# Patient Record
Sex: Male | Born: 1950 | Race: White | Hispanic: No | Marital: Married | Smoking: Former smoker
Health system: Southern US, Community
[De-identification: ages and names within clinical notes are randomized; demographics above are authoritative.]

## PROBLEM LIST (undated history)

## (undated) DIAGNOSIS — I639 Cerebral infarction, unspecified: Secondary | ICD-10-CM

## (undated) DIAGNOSIS — I10 Essential (primary) hypertension: Secondary | ICD-10-CM

## (undated) DIAGNOSIS — R0609 Other forms of dyspnea: Secondary | ICD-10-CM

## (undated) DIAGNOSIS — R51 Headache: Secondary | ICD-10-CM

## (undated) DIAGNOSIS — F32A Depression, unspecified: Secondary | ICD-10-CM

## (undated) DIAGNOSIS — I2510 Atherosclerotic heart disease of native coronary artery without angina pectoris: Secondary | ICD-10-CM

## (undated) DIAGNOSIS — I441 Atrioventricular block, second degree: Secondary | ICD-10-CM

## (undated) DIAGNOSIS — F329 Major depressive disorder, single episode, unspecified: Secondary | ICD-10-CM

## (undated) DIAGNOSIS — R600 Localized edema: Secondary | ICD-10-CM

## (undated) DIAGNOSIS — I739 Peripheral vascular disease, unspecified: Secondary | ICD-10-CM

## (undated) DIAGNOSIS — K449 Diaphragmatic hernia without obstruction or gangrene: Secondary | ICD-10-CM

## (undated) DIAGNOSIS — J45909 Unspecified asthma, uncomplicated: Secondary | ICD-10-CM

## (undated) DIAGNOSIS — M1990 Unspecified osteoarthritis, unspecified site: Secondary | ICD-10-CM

## (undated) DIAGNOSIS — E785 Hyperlipidemia, unspecified: Secondary | ICD-10-CM

## (undated) DIAGNOSIS — K219 Gastro-esophageal reflux disease without esophagitis: Secondary | ICD-10-CM

## (undated) DIAGNOSIS — K760 Fatty (change of) liver, not elsewhere classified: Secondary | ICD-10-CM

## (undated) DIAGNOSIS — R519 Headache, unspecified: Secondary | ICD-10-CM

## (undated) DIAGNOSIS — R0600 Dyspnea, unspecified: Secondary | ICD-10-CM

## (undated) DIAGNOSIS — G4730 Sleep apnea, unspecified: Secondary | ICD-10-CM

## (undated) HISTORY — DX: Essential (primary) hypertension: I10

## (undated) HISTORY — PX: PR VEIN BYPASS GRAFT,AORTO-FEM-POP: 35551

## (undated) HISTORY — DX: Localized edema: R60.0

## (undated) HISTORY — DX: Hyperlipidemia, unspecified: E78.5

## (undated) HISTORY — DX: Diaphragmatic hernia without obstruction or gangrene: K44.9

## (undated) HISTORY — PX: FINGER ARTHROPLASTY: SHX5017

## (undated) HISTORY — PX: TONSILLECTOMY: SUR1361

## (undated) HISTORY — DX: Sleep apnea, unspecified: G47.30

## (undated) HISTORY — PX: CARDIAC CATHETERIZATION: SHX172

## (undated) HISTORY — DX: Depression, unspecified: F32.A

## (undated) HISTORY — DX: Other forms of dyspnea: R06.09

## (undated) HISTORY — DX: Gastro-esophageal reflux disease without esophagitis: K21.9

## (undated) HISTORY — DX: Major depressive disorder, single episode, unspecified: F32.9

## (undated) HISTORY — DX: Dyspnea, unspecified: R06.00

## (undated) HISTORY — PX: CYSTOSCOPY W/ URETEROSCOPY W/ LITHOTRIPSY: SUR380

## (undated) HISTORY — DX: Atherosclerotic heart disease of native coronary artery without angina pectoris: I25.10

## (undated) HISTORY — DX: Peripheral vascular disease, unspecified: I73.9

## (undated) HISTORY — PX: COLONOSCOPY: SHX174

## (undated) HISTORY — DX: Atrioventricular block, second degree: I44.1

---

## 1998-01-28 ENCOUNTER — Ambulatory Visit (HOSPITAL_COMMUNITY): Admission: RE | Admit: 1998-01-28 | Discharge: 1998-01-28 | Payer: Self-pay | Admitting: Urology

## 1998-01-28 ENCOUNTER — Encounter: Payer: Self-pay | Admitting: Urology

## 1999-01-09 ENCOUNTER — Encounter: Admission: RE | Admit: 1999-01-09 | Discharge: 1999-01-09 | Payer: Self-pay | Admitting: *Deleted

## 1999-01-23 ENCOUNTER — Encounter (HOSPITAL_COMMUNITY): Admission: RE | Admit: 1999-01-23 | Discharge: 1999-04-23 | Payer: Self-pay | Admitting: Neurology

## 1999-08-27 ENCOUNTER — Encounter: Payer: Self-pay | Admitting: Urology

## 1999-08-27 ENCOUNTER — Ambulatory Visit (HOSPITAL_COMMUNITY): Admission: RE | Admit: 1999-08-27 | Discharge: 1999-08-27 | Payer: Self-pay | Admitting: Urology

## 1999-09-02 ENCOUNTER — Encounter: Admission: RE | Admit: 1999-09-02 | Discharge: 1999-09-02 | Payer: Self-pay | Admitting: Urology

## 1999-09-02 ENCOUNTER — Encounter: Payer: Self-pay | Admitting: Urology

## 1999-09-03 ENCOUNTER — Ambulatory Visit (HOSPITAL_COMMUNITY): Admission: RE | Admit: 1999-09-03 | Discharge: 1999-09-03 | Payer: Self-pay | Admitting: Urology

## 1999-09-08 ENCOUNTER — Encounter: Payer: Self-pay | Admitting: Urology

## 1999-09-08 ENCOUNTER — Ambulatory Visit (HOSPITAL_COMMUNITY): Admission: RE | Admit: 1999-09-08 | Discharge: 1999-09-08 | Payer: Self-pay | Admitting: Urology

## 2000-05-09 ENCOUNTER — Encounter: Payer: Self-pay | Admitting: Internal Medicine

## 2000-05-09 ENCOUNTER — Encounter: Admission: RE | Admit: 2000-05-09 | Discharge: 2000-05-09 | Payer: Self-pay | Admitting: Internal Medicine

## 2000-06-13 ENCOUNTER — Other Ambulatory Visit: Admission: RE | Admit: 2000-06-13 | Discharge: 2000-06-13 | Payer: Self-pay | Admitting: Internal Medicine

## 2000-10-18 ENCOUNTER — Encounter: Payer: Self-pay | Admitting: Urology

## 2000-10-18 ENCOUNTER — Encounter: Admission: RE | Admit: 2000-10-18 | Discharge: 2000-10-18 | Payer: Self-pay | Admitting: Urology

## 2001-01-23 ENCOUNTER — Ambulatory Visit (HOSPITAL_BASED_OUTPATIENT_CLINIC_OR_DEPARTMENT_OTHER): Admission: RE | Admit: 2001-01-23 | Discharge: 2001-01-23 | Payer: Self-pay | Admitting: Orthopedic Surgery

## 2003-04-10 ENCOUNTER — Encounter: Admission: RE | Admit: 2003-04-10 | Discharge: 2003-04-10 | Payer: Self-pay | Admitting: Cardiovascular Disease

## 2003-04-15 ENCOUNTER — Ambulatory Visit (HOSPITAL_COMMUNITY): Admission: RE | Admit: 2003-04-15 | Discharge: 2003-04-16 | Payer: Self-pay | Admitting: Cardiovascular Disease

## 2003-05-27 ENCOUNTER — Ambulatory Visit (HOSPITAL_COMMUNITY): Admission: RE | Admit: 2003-05-27 | Discharge: 2003-05-28 | Payer: Self-pay | Admitting: Cardiovascular Disease

## 2003-11-13 ENCOUNTER — Emergency Department (HOSPITAL_COMMUNITY): Admission: EM | Admit: 2003-11-13 | Discharge: 2003-11-13 | Payer: Self-pay | Admitting: Emergency Medicine

## 2006-07-05 ENCOUNTER — Encounter: Admission: RE | Admit: 2006-07-05 | Discharge: 2006-07-05 | Payer: Self-pay | Admitting: Cardiovascular Disease

## 2006-07-11 ENCOUNTER — Ambulatory Visit (HOSPITAL_COMMUNITY): Admission: RE | Admit: 2006-07-11 | Discharge: 2006-07-11 | Payer: Self-pay | Admitting: Cardiovascular Disease

## 2006-08-18 ENCOUNTER — Ambulatory Visit: Payer: Self-pay | Admitting: *Deleted

## 2006-08-29 ENCOUNTER — Inpatient Hospital Stay (HOSPITAL_COMMUNITY): Admission: RE | Admit: 2006-08-29 | Discharge: 2006-08-31 | Payer: Self-pay | Admitting: *Deleted

## 2006-08-29 ENCOUNTER — Ambulatory Visit: Payer: Self-pay | Admitting: *Deleted

## 2006-08-30 ENCOUNTER — Encounter (INDEPENDENT_AMBULATORY_CARE_PROVIDER_SITE_OTHER): Payer: Self-pay | Admitting: *Deleted

## 2006-08-31 ENCOUNTER — Ambulatory Visit: Payer: Self-pay | Admitting: *Deleted

## 2006-09-05 ENCOUNTER — Ambulatory Visit: Payer: Self-pay | Admitting: Vascular Surgery

## 2006-09-15 ENCOUNTER — Ambulatory Visit: Payer: Self-pay | Admitting: *Deleted

## 2006-10-13 ENCOUNTER — Ambulatory Visit: Payer: Self-pay | Admitting: *Deleted

## 2006-12-29 ENCOUNTER — Ambulatory Visit: Payer: Self-pay | Admitting: *Deleted

## 2007-04-19 ENCOUNTER — Ambulatory Visit: Payer: Self-pay | Admitting: *Deleted

## 2007-07-18 ENCOUNTER — Ambulatory Visit: Payer: Self-pay | Admitting: *Deleted

## 2007-10-02 ENCOUNTER — Ambulatory Visit: Payer: Self-pay | Admitting: Vascular Surgery

## 2008-10-16 ENCOUNTER — Ambulatory Visit: Payer: Self-pay | Admitting: Vascular Surgery

## 2009-03-29 DIAGNOSIS — I2510 Atherosclerotic heart disease of native coronary artery without angina pectoris: Secondary | ICD-10-CM

## 2009-03-29 HISTORY — DX: Atherosclerotic heart disease of native coronary artery without angina pectoris: I25.10

## 2009-04-29 HISTORY — PX: ANGIOPLASTY: SHX39

## 2009-05-14 ENCOUNTER — Inpatient Hospital Stay (HOSPITAL_COMMUNITY): Admission: EM | Admit: 2009-05-14 | Discharge: 2009-05-20 | Payer: Self-pay | Admitting: Emergency Medicine

## 2009-05-23 ENCOUNTER — Encounter: Payer: Self-pay | Admitting: Emergency Medicine

## 2009-05-23 ENCOUNTER — Inpatient Hospital Stay (HOSPITAL_COMMUNITY): Admission: EM | Admit: 2009-05-23 | Discharge: 2009-05-27 | Payer: Self-pay | Admitting: Neurological Surgery

## 2009-06-26 ENCOUNTER — Observation Stay (HOSPITAL_COMMUNITY): Admission: RE | Admit: 2009-06-26 | Discharge: 2009-06-27 | Payer: Self-pay | Admitting: Cardiovascular Disease

## 2009-06-27 HISTORY — PX: ANGIOPLASTY: SHX39

## 2009-07-14 ENCOUNTER — Ambulatory Visit (HOSPITAL_COMMUNITY): Admission: RE | Admit: 2009-07-14 | Discharge: 2009-07-14 | Payer: Self-pay | Admitting: Cardiovascular Disease

## 2009-07-16 ENCOUNTER — Ambulatory Visit (HOSPITAL_COMMUNITY): Admission: RE | Admit: 2009-07-16 | Discharge: 2009-07-17 | Payer: Self-pay | Admitting: Cardiovascular Disease

## 2009-12-26 ENCOUNTER — Ambulatory Visit: Payer: Self-pay | Admitting: Vascular Surgery

## 2010-04-19 ENCOUNTER — Encounter: Payer: Self-pay | Admitting: Neurology

## 2010-06-16 LAB — BASIC METABOLIC PANEL
BUN: 10 mg/dL (ref 6–23)
BUN: 6 mg/dL (ref 6–23)
Calcium: 9.3 mg/dL (ref 8.4–10.5)
Chloride: 104 mEq/L (ref 96–112)
Creatinine, Ser: 0.92 mg/dL (ref 0.4–1.5)
GFR calc Af Amer: >60 mL/min (ref >60)
GFR calc non Af Amer: >60 mL/min (ref >60)
GFR calc non Af Amer: >60 mL/min (ref >60)
Glucose, Bld: 113 mg/dL — ABNORMAL HIGH (ref 70–99)
Glucose, Bld: 84 mg/dL (ref 70–99)
Sodium: 137 mEq/L (ref 135–145)
Sodium: 138 mEq/L (ref 135–145)

## 2010-06-16 LAB — MRSA PCR SCREENING: MRSA by PCR: NEGATIVE

## 2010-06-16 LAB — PROTIME-INR: INR: 1.13 (ref 0.00–1.49)

## 2010-06-16 LAB — CBC
HCT: 35 % — ABNORMAL LOW (ref 39.0–52.0)
Hemoglobin: 14.3 g/dL (ref 13.0–17.0)
Platelets: 137 10*3/uL — ABNORMAL LOW (ref 150–400)
RDW: 15.3 % (ref 11.5–15.5)
WBC: 6.1 10*3/uL (ref 4.0–10.5)

## 2010-06-17 LAB — BASIC METABOLIC PANEL
BUN: 10 mg/dL (ref 6–23)
BUN: 15 mg/dL (ref 6–23)
BUN: 21 mg/dL (ref 6–23)
BUN: 10 mg/dL (ref 6–23)
CO2: 28 mEq/L (ref 19–32)
CO2: 31 mEq/L (ref 19–32)
CO2: 29 mEq/L (ref 19–32)
Calcium: 9 mg/dL (ref 8.4–10.5)
Calcium: 9.1 mg/dL (ref 8.4–10.5)
Calcium: 9.6 mg/dL (ref 8.4–10.5)
Chloride: 101 mEq/L (ref 96–112)
Chloride: 101 mEq/L (ref 96–112)
Creatinine, Ser: 0.98 mg/dL (ref 0.4–1.5)
Creatinine, Ser: 0.99 mg/dL (ref 0.4–1.5)
Creatinine, Ser: 1.06 mg/dL (ref 0.4–1.5)
GFR calc Af Amer: >60 mL/min (ref >60)
GFR calc non Af Amer: >60 mL/min (ref >60)
GFR calc non Af Amer: >60 mL/min (ref >60)
Glucose, Bld: 108 mg/dL — ABNORMAL HIGH (ref 70–99)
Glucose, Bld: 144 mg/dL — ABNORMAL HIGH (ref 70–99)
Glucose, Bld: 125 mg/dL — ABNORMAL HIGH (ref 70–99)
Potassium: 3.9 mEq/L (ref 3.5–5.1)
Sodium: 132 mEq/L — ABNORMAL LOW (ref 135–145)
Sodium: 138 mEq/L (ref 135–145)

## 2010-06-17 LAB — CBC
HCT: 39.8 % (ref 39.0–52.0)
HCT: 40.5 % (ref 39.0–52.0)
HCT: 44 % (ref 39.0–52.0)
HCT: 47.4 % (ref 39.0–52.0)
Hemoglobin: 13.7 g/dL (ref 13.0–17.0)
Hemoglobin: 13.9 g/dL (ref 13.0–17.0)
Hemoglobin: 14.5 g/dL (ref 13.0–17.0)
Hemoglobin: 15.1 g/dL (ref 13.0–17.0)
Hemoglobin: 15.3 g/dL (ref 13.0–17.0)
MCHC: 34.3 g/dL (ref 30.0–36.0)
MCHC: 34.5 g/dL (ref 30.0–36.0)
MCHC: 34.6 g/dL (ref 30.0–36.0)
MCHC: 34.7 g/dL (ref 30.0–36.0)
MCHC: 34.8 g/dL (ref 30.0–36.0)
MCHC: 34.2 g/dL (ref 30.0–36.0)
MCV: 93.3 fL (ref 78.0–100.0)
MCV: 94.6 fL (ref 78.0–100.0)
MCV: 95.3 fL (ref 78.0–100.0)
MCV: 95.3 fL (ref 78.0–100.0)
MCV: 95.3 fL (ref 78.0–100.0)
MCV: 93.6 fL (ref 78.0–100.0)
Platelets: 215 10*3/uL (ref 150–400)
Platelets: 219 10*3/uL (ref 150–400)
Platelets: 252 10*3/uL (ref 150–400)
Platelets: 286 10*3/uL (ref 150–400)
RBC: 4.26 MIL/uL (ref 4.22–5.81)
RBC: 4.4 MIL/uL (ref 4.22–5.81)
RBC: 4.47 MIL/uL (ref 4.22–5.81)
RBC: 4.65 MIL/uL (ref 4.22–5.81)
RBC: 4.49 MIL/uL (ref 4.22–5.81)
RDW: 12.4 % (ref 11.5–15.5)
RDW: 13 % (ref 11.5–15.5)
RDW: 13.2 % (ref 11.5–15.5)
RDW: 13.4 % (ref 11.5–15.5)
RDW: 13.5 % (ref 11.5–15.5)
RDW: 13.4 % (ref 11.5–15.5)
WBC: 12.2 10*3/uL — ABNORMAL HIGH (ref 4.0–10.5)
WBC: 16.6 10*3/uL — ABNORMAL HIGH (ref 4.0–10.5)
WBC: 5.4 10*3/uL (ref 4.0–10.5)
WBC: 6.7 10*3/uL (ref 4.0–10.5)

## 2010-06-17 LAB — LIPID PANEL
HDL: 41 mg/dL (ref >39)
VLDL: 20 mg/dL (ref 0–40)

## 2010-06-17 LAB — PROTIME-INR
INR: 1.14 (ref 0.00–1.49)
INR: 1.16 (ref 0.00–1.49)
Prothrombin Time: 14.5 seconds (ref 11.6–15.2)

## 2010-06-17 LAB — TSH: TSH: 0.903 u[IU]/mL (ref 0.350–4.500)

## 2010-06-17 LAB — MRSA PCR SCREENING: MRSA by PCR: NEGATIVE

## 2010-06-17 LAB — CARDIAC PANEL(CRET KIN+CKTOT+MB+TROPI)
CK, MB: 3.9 ng/mL (ref 0.3–4.0)
Troponin I: 0.03 ng/mL (ref 0.00–0.06)

## 2010-06-17 LAB — HEPATIC FUNCTION PANEL
ALT: 36 U/L (ref 0–53)
AST: 31 U/L (ref 0–37)
Albumin: 3.6 g/dL (ref 3.5–5.2)
Alkaline Phosphatase: 85 U/L (ref 39–117)
Total Bilirubin: 1.5 mg/dL — ABNORMAL HIGH (ref 0.3–1.2)

## 2010-06-17 LAB — COMPREHENSIVE METABOLIC PANEL
ALT: 31 U/L (ref 0–53)
Albumin: 3.3 g/dL — ABNORMAL LOW (ref 3.5–5.2)
BUN: 11 mg/dL (ref 6–23)
BUN: 17 mg/dL (ref 6–23)
CO2: 30 mEq/L (ref 19–32)
Calcium: 8.8 mg/dL (ref 8.4–10.5)
Chloride: 98 mEq/L (ref 96–112)
Creatinine, Ser: 0.88 mg/dL (ref 0.4–1.5)
Creatinine, Ser: 0.97 mg/dL (ref 0.4–1.5)
GFR calc non Af Amer: >60 mL/min (ref >60)
Glucose, Bld: 106 mg/dL — ABNORMAL HIGH (ref 70–99)
Glucose, Bld: 112 mg/dL — ABNORMAL HIGH (ref 70–99)
Sodium: 136 mEq/L (ref 135–145)
Total Bilirubin: 1.6 mg/dL — ABNORMAL HIGH (ref 0.3–1.2)
Total Protein: 6.7 g/dL (ref 6.0–8.3)

## 2010-06-17 LAB — HEMOGLOBIN A1C
Hgb A1c MFr Bld: 5.8 % (ref 4.6–6.1)
Mean Plasma Glucose: 120 mg/dL

## 2010-06-17 LAB — URINALYSIS, ROUTINE W REFLEX MICROSCOPIC
Bilirubin Urine: NEGATIVE
Glucose, UA: NEGATIVE mg/dL
Hgb urine dipstick: NEGATIVE
Ketones, ur: 15 mg/dL — AB
Protein, ur: NEGATIVE mg/dL

## 2010-06-17 LAB — DIFFERENTIAL
Basophils Relative: 3 % — ABNORMAL HIGH (ref 0–1)
Eosinophils Absolute: 0 10*3/uL (ref 0.0–0.7)
Eosinophils Relative: 0 % (ref 0–5)
Lymphocytes Relative: 13 % (ref 12–46)
Neutro Abs: 9.4 10*3/uL — ABNORMAL HIGH (ref 1.7–7.7)
Neutrophils Relative %: 77 % (ref 43–77)

## 2010-06-17 LAB — HEPARIN LEVEL (UNFRACTIONATED): Heparin Unfractionated: 0.54 IU/mL (ref 0.30–0.70)

## 2010-06-17 LAB — APTT: aPTT: 41 seconds — ABNORMAL HIGH (ref 24–37)

## 2010-06-17 LAB — CK TOTAL AND CKMB (NOT AT ARMC): Total CK: 209 U/L (ref 7–232)

## 2010-08-11 NOTE — Procedures (Signed)
BYPASS GRAFT EVALUATION   INDICATION:  Follow-up evaluation of lower extremity bypass graft.   HISTORY:  Diabetes:  No.  Cardiac:  No.  Hypertension:  Yes.  Smoking:  Patient is a former smoker.  Previous Surgery:  Right above-knee to below-knee pop bypass graft with  saphenous vein on 08/29/06 by Dr. Madilyn Fireman.  Patient has an occluded right  popliteal stent.   SINGLE LEVEL ARTERIAL EXAM                               RIGHT              LEFT  Brachial:                    146                155  Anterior tibial:             152                151  Posterior tibial:            156                172  Peroneal:  Ankle/brachial index:        >1.0               >1.0   PREVIOUS ABI:  Date: 07/20/07  RIGHT:  >1.0  LEFT:  >1.0   LOWER EXTREMITY BYPASS GRAFT DUPLEX EXAM:   DUPLEX:  Doppler arterial waveforms are triphasic proximal to, within,  and distal to the above-knee to below-knee pop bypass graft in the right  leg.   IMPRESSION:  1. Patent right above-knee to below-knee popliteal bypass graft.  2. Ankle brachial indices are stable from previous study bilaterally.   ___________________________________________  P. Liliane Bade, M.D.   MC/MEDQ  D:  10/02/2007  T:  10/02/2007  Job:  [id number]

## 2010-08-11 NOTE — Assessment & Plan Note (Signed)
OFFICE VISIT   Romero, Jacob  DOB:  12/20/1950                                       10/13/2006  ZOXWR#:[id number]   The patient is status post right superficial femoral artery to popliteal  bypass reverse saphenous vein graft, 08/29/2006, [facility].  He continues to do well.  His postoperative ankle-brachial index is 1.0.   He has had some mild swelling in his leg.  This, however, has been  improving.   BP is 155, pulse 80 per minute, regular respirations 18 per minute.  His  leg incision is healing unremarkably.  A 2+ right dorsalis pedis pulse.   The patient is doing reasonably well following his surgery.  No  complicating factors evident.  I have given him permission to return to  work, 10/31/2006, at his place of employment.  He is going to wear a  venous compression stocking for control of swelling at that time.  He  will return per routine protocol.   Balinda Quails, M.D.  Electronically Signed   PGH/MEDQ  D:  10/13/2006  T:  10/14/2006  Job:  [id number]   cc:   Nanetta Batty, M.D.  [name]

## 2010-08-11 NOTE — Procedures (Signed)
BYPASS GRAFT EVALUATION   INDICATION:  Follow up right leg bypass graft.   HISTORY:  Diabetes:  No.  Cardiac:  No.  Hypertension:  Yes.  Smoking:  Quit in 1993.  Previous Surgery:  Right femoral to popliteal artery bypass graft with  reversed saphenous vein  on 08/29/06 by Dr. Madilyn Fireman. The patient also has  a history of right popliteal artery stent.   SINGLE LEVEL ARTERIAL EXAM                               RIGHT              LEFT  Brachial:                    160                160  Anterior tibial:             140                166  Posterior tibial:            170                184  Peroneal:  Ankle/brachial index:        > 1.0              > 1.0   PREVIOUS ABI:  Date: 09/15/06  RIGHT:  > 1.0  LEFT:  > 1.0   LOWER EXTREMITY BYPASS GRAFT DUPLEX EXAM:   DUPLEX:  Doppler arterial waveforms are triphasic proximal to,  throughout and distal to the graft.  Velocities are within normal limits  throughout.   IMPRESSION:  1. Patent right femoral to popliteal artery bypass graft.  2. ABIs are stable bilaterally.   ___________________________________________  P. Liliane Bade, M.D.   DP/MEDQ  D:  12/29/2006  T:  12/30/2006  Job:  [id number]

## 2010-08-11 NOTE — Procedures (Signed)
BYPASS GRAFT EVALUATION   INDICATION:  Followup right leg bypass graft.   HISTORY:  Diabetes:  No.  Cardiac:  No.  Hypertension:  Yes.  Smoking:  Quit in 1993.  Previous Surgery:  Right above knee to below knee popliteal artery  bypass graft with saphenous vein on 08/29/2006 by Dr. Madilyn Fireman.  The  patient also has a history of an occluded right popliteal stent.   SINGLE LEVEL ARTERIAL EXAM                               RIGHT              LEFT  Brachial:                    120                120  Anterior tibial:             120                140  Posterior tibial:            144                144  Peroneal:  Ankle/brachial index:        >1.0               >1.0   PREVIOUS ABI:  Date: 12/29/2006  RIGHT:  >1.0  LEFT:  >1.0   LOWER EXTREMITY BYPASS GRAFT DUPLEX EXAM:   DUPLEX:  Doppler arterial waveforms are triphasic proximal to,  throughout and distal to the graft.  Velocities are within normal  limits.   IMPRESSION:  1. Patent right above knee to below knee popliteal artery bypass      graft.  2. ABIs are within normal limits bilaterally.    ___________________________________________  P. Liliane Bade, M.D.   DP/MEDQ  D:  04/19/2007  T:  04/19/2007  Job:  [id number]

## 2010-08-11 NOTE — Assessment & Plan Note (Signed)
OFFICE VISIT   Jacob Romero, Jacob Romero  DOB:  02-06-1951                                       09/15/2006  NWGNF#:[id number]   The patient is status post right superficial femoral popliteal bypass  with reverse saphenous vein graft on 09/16/2006 at [facility].   Postoperative Doppler evaluation reveals right ankle-brachial index  greater than 1.0 with preoperative value of 0.64.  Appropriate  improvement following his surgery.   BP 157/114, pulse 85 per minute, respiration is 18 per minute.  Incisions are healing adequately with mild eschar noted above and below  the knee.  No evidence of infection.  Good distal flow.  There is 1+  dorsalis pedis pulse.   The patient has had considerable improvement in his claudication  symptoms.  Will plan followup again in 1 month.  Plan to return to work  approximately 6 weeks following surgery.   Balinda Quails, M.D.  Electronically Signed   PGH/MEDQ  D:  09/15/2006  T:  09/16/2006  Job:  [id number]   cc:   Nanetta Batty, M.D.  Jackquline Bosch

## 2010-08-11 NOTE — Discharge Summary (Signed)
NAME:  Jacob Romero, Jacob Romero NO.:  [id number]   MEDICAL RECORD NO.:  [id number]          PATIENT TYPE:  INP   LOCATION:  2004                         FACILITY:  [facility]   PHYSICIAN:  Balinda Quails, M.D.    DATE OF BIRTH:  [birth year]   DATE OF ADMISSION:  08/29/2006  DATE OF DISCHARGE:  08/31/2006                               DISCHARGE SUMMARY   HISTORY OF PRESENT ILLNESS:  The patient is a 60 year old male with a  history of peripheral vascular disease.  He underwent a [place]  atherectomy and IntraCoil stenting of the right popliteal artery on  May 27, 2003.  He developed recurrent claudication symptoms and  Doppler evaluation revealed occlusion of the right popliteal stented  segment.  He has undergone angiography carried out by Dr. Allyson Sabal which  reveals occlusion in the popliteal artery at the stented segment with  reconstitution of the below-knee popliteal by collaterals.  He has  intact 3-vessel runoff.  He was referred for surgical opinion to Dr.  Madilyn Fireman who evaluated the patient's studies and recommended that he should  undergo a bypass procedure.   PAST MEDICAL HISTORY:  1. Hypertension.  2. Hyperlipidemia.   MEDICATIONS:  Medications prior to admission include the following:  1. Lisinopril/HCTZ 20/12.5 mg 2 tablets daily.  2. Omeprazole 20 mg daily.  3. Plavix 75 mg daily.  4. Lexapro 10 mg daily.  5. Toprol XL 25 mg daily.  6. Diovan 80 mg daily.  7. Aspirin 81 mg daily.  8. Claritin OTC daily p.r.n.  9. Aleve 220 mg 1-2 daily as needed.  10.Excedrin AF 2 tablets daily as needed.  11.Multivitamin 1 daily.   FAMILY HISTORY:  Please see the history and physical done at the time of  admission.   SOCIAL HISTORY:  Please see the history and physical done at the time of  admission.   REVIEW OF SYSTEMS:  Please see the history and physical done at the time  of admission.   PHYSICAL EXAMINATION:  Please see the history and physical done at the  time of admission.   HOSPITAL COURSE:  The patient was admitted electively and taken to the  operating room at which time he underwent the following procedure.  Right superficial femoral to popliteal bypass with reversed saphenous  vein.  He tolerated the procedure well and was taken to the  postanesthesia care unit in stable condition.   POSTOPERATIVE HOSPITAL COURSE:  The patient has had a routine recovery.  He has remained hemodynamically stable.  He has a palpable right  posterior tibial pulse.  He had postoperative hypokalemia but has been  supplemented.  His ABIs on the right have gone from 0.63 preoperatively  to 0.94 postoperatively.  His left side is stable at 1.14. His incisions  are all healing well without evidence of bleeding, hematoma or  infection.  He has tolerated a routine advancement in activity  commensurate for level of postoperative convalescence using standard  protocols.  His overall status is felt to be stable for tentative  discharge in the morning of August 31, 2006, pending morning  round re-  evaluation.   DISCHARGE MEDICATIONS:  As preoperatively.  Additionally, for pain Tylox  1-2 every 4-6 hours as needed.   INSTRUCTIONS:  The patient received written instructions in regard to  medications, activity, diet, wound care and followup.  Followup will  include Dr. Madilyn Fireman on September 15, 2006, at 3 p.m.   CONDITION ON DISCHARGE:  Stable and improved.   FINAL DIAGNOSIS:  Right peripheral vascular occlusive disease as  described above, now status post revascularization also as described  above.      Rowe Clack, P.A.-C.      Balinda Quails, M.D.  Electronically Signed    WEG/MEDQ  D:  08/30/2006  T:  08/30/2006  Job:  [id number]   cc:   Nanetta Batty, M.D.  [name]

## 2010-08-11 NOTE — Procedures (Signed)
BYPASS GRAFT EVALUATION   INDICATION:  Right lower extremity bypass graft.   HISTORY:  Diabetes:  No.  Cardiac:  No.  Hypertension:  Yes.  Smoking:  Previous.  Previous Surgery:  Right distal superficial femoral to popliteal artery  bypass graft on 08/29/2006, known occlusion of a right popliteal artery  stent.   SINGLE LEVEL ARTERIAL EXAM                               RIGHT              LEFT  Brachial:                    157                165  Anterior tibial:             180                175  Posterior tibial:            183                201  Peroneal:  Ankle/brachial index:        1.11               1.22   PREVIOUS ABI:  Date: 10/16/2008  RIGHT:  1.13  LEFT:  1.23   LOWER EXTREMITY BYPASS GRAFT DUPLEX EXAM:   DUPLEX:  Biphasic Doppler waveforms noted throughout the right lower  extremity bypass graft in its native vessels with no significant  increase in velocities.   IMPRESSION:  1. Patent right distal superficial femoral to popliteal artery bypass      graft with no evidence of stenosis.  2. Stable bilateral ankle brachial indices.         ___________________________________________  Quita Skye Hart Rochester, M.D.   CH/MEDQ  D:  12/29/2009  T:  12/29/2009  Job:  [id number]

## 2010-08-11 NOTE — Assessment & Plan Note (Signed)
OFFICE VISIT   OLNEY, MONIER  DOB:  Mar 20, 1951                                       09/05/2006  UEAVW#:[id number]   The patient presents today for concern regarding his right leg. He is  one week status post right fem-pop bypass per Dr.  Madilyn Fireman. He had called  on Friday concerning erythema and low-grade fever and was called in a  prescription for Keflex. He called today continuing to be concerned  regarding his leg.   He does have some erythema over the incision. He had an above-knee to  below-knee popliteal bypass for popliteal occlusive disease. He has a 2+  anterior tibial pulse at his ankle with moderate amount of swelling and  bruising around the incision and swelling down onto his foot. This all  looks typical at one week status post fem-pop. I do not see any evidence  of invasive infection.   He was reassured. He will continue his activity as tolerated. I have  asked him to try Motrin in addition to the Tylox for discomfort and he  was written a prescription for 40 Tylox 1-2 q4-6 hours p.r.n. pain.   Larina Earthly, M.D.  Electronically Signed   TFE/MEDQ  D:  09/05/2006  T:  09/05/2006  Job:  [id number]

## 2010-08-11 NOTE — Procedures (Signed)
BYPASS GRAFT EVALUATION   INDICATION:  Right lower extremity bypass graft.   HISTORY:  Diabetes:  No  Cardiac:  No  Hypertension:  Yes  Smoking:  Previous  Previous Surgery:  Right above-knee to below-knee popliteal artery  bypass graft on 08/29/2006, known occlusion of an old right popliteal  artery stent.   SINGLE LEVEL ARTERIAL EXAM                               RIGHT              LEFT  Brachial:                    123                126  Anterior tibial:             130                149  Posterior tibial:            143                155  Peroneal:  Ankle/brachial index:        1.13               1.23   PREVIOUS ABI:  Date: 10/02/2007  RIGHT:  Greater than 1.0  LEFT:  Greater  than 1.0   LOWER EXTREMITY BYPASS GRAFT DUPLEX EXAM:   DUPLEX:  Biphasic Doppler waveforms noted throughout the right lower  extremity bypass graft with no focal increase in velocities noted.   IMPRESSION:  1. Patent right above-knee to below-knee popliteal artery bypass graft      with no evidence of stenosis.  2. Stable bilateral ankle-brachial indices.        ___________________________________________  P. Liliane Bade, M.D.   CH/MEDQ  D:  10/16/2008  T:  10/17/2008  Job:  [id number]

## 2010-08-11 NOTE — Op Note (Signed)
NAME:  DESMUND, ELMAN NO.:  [id number]   MEDICAL RECORD NO.:  [id number]          PATIENT TYPE:  INP   LOCATION:  2899                         FACILITY:  [facility]   PHYSICIAN:  Balinda Quails, M.D.    DATE OF BIRTH:  April 28, 1950   DATE OF PROCEDURE:  08/29/2006  DATE OF DISCHARGE:                               OPERATIVE REPORT   SURGEON:  Balinda Quails, M.D.   ASSISTANT:  Quita Skye. Hart Rochester, M.D. and Jerold Coombe, P.A.   ANESTHESIA:  General endotracheal.   ANESTHESIOLOGIST:  Judie Petit, M.D.   PREOPERATIVE DIAGNOSIS:  Right lower extremity claudication.   POSTOPERATIVE DIAGNOSIS:  Right lower extremity claudication.   PROCEDURE:  Right superficial femoral popliteal bypass with reversed  saphenous vein graft.   HISTORY:  Mr. Gabrielsen is a 60 year old male with a history of popliteal  occlusive disease.  He has previously undergone angioplasty and stenting  of his right popliteal artery.  This, however, has subsequently  occluded.  He was referred for bypass for relief of limiting  claudication.   OPERATIVE PROCEDURE:  The patient was brought to the operating room in  stable condition.  He was placed in supine position.  The right leg  prepped and draped in sterile fashion.  General endotracheal anesthesia  induced.   Longitudinal skin incision made along the posterior margin of the tibia  at the knee.  Dissection carried through the subcutaneous tissue.  Saphenous vein identified.  This was an adequate sized vein.  The  tributaries of the vein ligated with 3-0 silk and divided.  The vein  mobilized up to the knee joint.  A second skin incision then made  proximally in the thigh.  The vein exposed through this incision for an  adequate length.   The popliteal artery was then exposed through a medial approach.  The  gastrocnemius fascia incised.  The popliteal vein mobilized and  reflected posteriorly.  The popliteal artery identified.  This was a  patent vessel; however, small in caliber.  The artery mobilized down to  the origin of the anterior tibial and tibioperoneal trunk which were  encircled with vessel loops.   Above the knee, the incision was extended deeply and the distal  superficial femoral artery identified at the adductor canal.  It was  freed and encircled with vessel loops proximally and distally.  This  revealed an excellent pulse.   The tunnel was then created between the heads of the gastrocnemius  muscles up from above the knee to below the knee.   The vein harvested, ligating it proximally and distally with 2-0 silk.  Dilated with heparin and saline solution, and it was a good-quality  vein.   The patient administered 7000 units heparin intravenously.  Adequate  circulation time permitted.  The distal superficial femoral artery was  then controlled with bulldog clamps.  A longitudinal arteriotomy made.  The vein was beveled and anastomosed end-to-side to the end to the  distal superficial femoral artery using running 6-0 Prolene suture.  The  vein then flushed.  In reverse fashion, the  vein was then tunneled down  to the popliteal artery.   The popliteal artery controlled proximally and distally with bulldog  clamps.  Longitudinal arteriotomy made.  The vein trimmed at appropriate  length and anastomosed end-to-side to the popliteal artery using running  6-0 Prolene suture.  The vein then flushed.  Clamps were then removed.  Excellent flow present.  Adequate hemostasis obtained.  Sponge and  instrument counts correct.   The incisions were closed with a deep layer of interrupted 2-0 Vicryl  suture.  Subcutaneous layer of running 3-0 Vicryl suture.  Skin closed  with 4-0 Monocryl.  Dermabond applied.   The patient tolerated procedure well.  No apparent complications.  Transferred to recovery room in stable condition.      Balinda Quails, M.D.  Electronically Signed     PGH/MEDQ  D:  08/29/2006  T:   08/29/2006  Job:  [id number]   cc:   Nanetta Batty, M.D.  [name]

## 2010-08-14 NOTE — Op Note (Signed)
[facility]  Patient:    Jacob Romero, Jacob Romero                      MRN: [id number] Proc. Date: 09/03/99 Adm. Date:  [id number] Attending:  Thermon Leyland                           Operative Report  PREOPERATIVE DIAGNOSES: 1. Right midureteral calculus 8 mm. 2. Questionable left ureteropelvic junction stone.  POSTOPERATIVE DIAGNOSES: 1. Right midureteral calulus 8 mm. 2. No stone seen on left.  PROCEDURE PERFORMED:  Cystoscopy, bilateral retrograde pyelograms, bilateral ureteroscopy, holmium laser lithotripsy, and bilateral double J stent placement.  SURGEON:  Barron Alvine, M.D.  ANESTHESIA:  General.  INDICATIONS:  Jacob Romero is a 60 year old male.  He presented initially with intermittent right flank pain, and an intravenous pyelogram performed at an outside institution showed what appeared to be an 8-10 mm stone ball-valving in and out of the right ureteropelvic junction.  The patient elected to have in-situ lithotripsy.  At the time of lithotripsy, the stone had migrated to the proximal ureter.  There was very poor fragmentation during lithotripsy. Of note, the patient was only having symptoms on the right side at this time where the known stone was.  Just prior to lithotripsy, he reported to me that he had also had one episode of left-sided flank pain, and on careful review of his previous IVP, we appreciated no evidence of obstruction or stones on the left.  On follow-up from his lithotripsy, the patient continued to have pain and was not passing any fragments.  A KUB showed distal migration of the calculus to approximately the midureter, but there appeared to be no evidence of fragmentation.  In addition, the KUB showed a questionable faint 5-6 mm calcification in the area of the left ureteropelvic junction.  The patient was intermittently complaining of significant right-sided flank pain and occasionally having twinges of left-sided  discomfort.  For that reason, we elected to bring him to the operating room for further, more definitive treatment of his right ureteral stone and also to investigate his left-sided complaints and questionable faint calcification on KUB.  PROCEDURE AND FINDINGS:  The patient was brought to the operating room, where he had successful induction of general anesthesia.  Placed in the lithotomy position and prepped and draped in the usual manner.  Cystoscopy revealed mild trilobar hyperplasia with a fairly prominent median bar.  Both the ureteral orifices were unremarkable, and there was no pathology within the bladder. Right-sided retrograde pyelogram showed an 8 mm filling defect in the midureter.  With some difficulty, a guidewire was able to be passed past the stone and into the renal pelvis.  We initially started the procedure with rigid ureteroscopy.  We had no difficulty engaging the ureter.  As we approached the midureter, the angle of the ureter made it very difficult to safely proceed with further advancement of the ureteroscope.  We were just below the stone but because of the impaction of the stone and mucosal edema, we did not feel that we could safely proceed with rigid ureteroscopy.  For that reason, a second guidewire was placed beyond the stone and flexible ureteroscopy was performed.  Through the flexible ureteroscope, one could visualize the stone.  It was partly impacted in the wall of the ureter.  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  Through  the flexible ureteroscope, a holmium laser fiber was inserted, and the stone was fractured into numerous pieces, none larger than 2-3 mm. The stone was very hard, and fragment was very slow and initially incomplete. We continued to work on individual fragments until we felt that all these were sufficiently small to allow for passage.  The patient also had a question of a more proximal  stone in his ureter on the right.  We performed flexible ureteroscopy all the way up to the renal pelvis and did not appreciate any other obvious more proximal stones.  He is known to have some very small stones in his right kidney that we did not feel really justified retrieving at that juncture.  At the completion of this procedure, there was no evidence of obvious ureteral injury.  A double J stent was placed.  This was a 26 cm, 6 [place] double J stent.  This was placed without difficulty.  Attention was then turned to the left side.  Retrograde pyelogram did not reveal any obvious filling defects and no evidence of obvious obstruction. Because of the patients ongoing and intermittent complaints of pain as well as the question of suspicious calcification, I decided to go ahead and perform ureteroscopy of the entire left system and renal pelvis.  An access sheath was placed over a guidewire.  We then placed the flexible ureteroscope through the access sheath up to the renal pelvis.  We were able to see all the calyces with the exception of the most inferior calyx.  This was due to angulation, which made approach by the ureteroscope difficult.  We thought if the stone were in that position, it was unlikely to be causing any pain, and if a stone could be visualized, it would be difficult to treat with the limitations of the flexibility of the ureteroscope with the laser fiber in place.  I encountered no obvious stones in the renal pelvis.  On careful visualization of the entire ureter on the way out, I saw no other evidence of stones. Because dilation with the access sheath had been performed, we elected to go ahead and place a 6 [place] double J stent in that side as well.  The patient was brought to the recovery room in stable condition. DD:  09/03/99 TD:  09/08/99 Job: [id number]

## 2010-08-14 NOTE — Discharge Summary (Signed)
NAME:  Jacob Romero, Jacob Romero                         ACCOUNT NO.:  [id number]   MEDICAL RECORD NO.:  [id number]                   PATIENT TYPE:  OIB   LOCATION:  3735                                 FACILITY:  [facility]   PHYSICIAN:  Nanetta Batty, M.D.                DATE OF BIRTH:  1950/05/01   DATE OF ADMISSION:  05/27/2003  DATE OF DISCHARGE:  05/28/2003                                 DISCHARGE SUMMARY   [name] is a 60 year old white, married male patient who came in  the hospital for outpatient PV angiogram.  He has history of a previous PV  angiogram done on April 15, 2003; he had a short segment of total  occlusion of the right popliteal with constitution by collaterals and 3-  vessel runoff.  Dr. Allyson Sabal performed a PTA of the area.  He was not able to  put a stent in because of the bend in the knee.  He decided to do follow up  Doppler and considered him as a candidate for a [place] atherectomy at  that time.  Thus he came back in the hospital for the [facility] atherectomy  procedure which was performed by Dr. Nanetta Batty.  Please see his PV  angiogram report for complete details.  Apparently post-procedure he had a  problem with a hematoma.  However when seen on May 28, 2003 by Dr. Allyson Sabal  his hematoma was soft and much less edematous, he only had a small bruise in  the area of his left groin.   His blood pressure was 131/77, his pulse was 76.  When I came to discharge  him I reviewed his MMR profile that had been done on April 24, 2003 which  showed very elevated LDL particles for which he was placed on Lipitor 20 mg,  his LDL size was also small.  His large HDL was borderline intermediate at  13 and he had very elevated large VLDL.  Thus it was decided to add Niaspan  500 mg once daily and Mega Tree fish oil capsules at discharge.  This was  discussed with him and his wife.   DISCHARGE MEDICATIONS:  1. Aspirin 81 mg once daily.  2. Plavix 75 mg once daily.  3. Multivitamin once daily.  4. Protonix 40 mg once daily.  5. Lisinopril hydrochlorothiazide 20/12.5 once daily.  6. Lipitor 20 mg at h.s.  7. Wellbutrin XL 300 mg daily.  8. Niaspan 500 mg once daily and in one month this will be increased to 500     mg x2, and then get a repeat lipid profile at the beginning of May.  9. He should take 3 capsules of Mega fish oil every days.   DISCHARGE INSTRUCTIONS:  He is to avoid strenuous activity and return to  work Monday for only light duty.  He should be on a low saturated fat diet  and decrease  his simple carbohydrates.  He will follow up with lower  extremity Dopplers on May 30, 2003 and follow up with Dr. Allyson Sabal on June 10, 2003.   DISCHARGE DIAGNOSES:  1. Atherosclerotic peripheral vascular disease.  2. Mixed dyslipidemia.  3. Hypertension.  4. Hiatal hernia.  5. Gastroesophageal reflux disease.  6. History of depression.      Lezlie Octave, N.P.                        Nanetta Batty, M.D.    BB/MEDQ  D:  05/28/2003  T:  05/29/2003  Job:  [id number]   cc:   [name]

## 2010-08-14 NOTE — Op Note (Signed)
[facility]  Patient:    Jacob Romero, Jacob Romero Visit Number: [id number] MRN: [id number]          Service Type: Attending:  Nicki Reaper, M.D. Dictated by:   Nicki Reaper, M.D. Proc. Date: 01/23/01                             Operative Report  PREOPERATIVE DIAGNOSIS: Ruptured flexor sheath, right middle finger.  POSTOPERATIVE DIAGNOSIS: Ruptured flexor sheath, right middle finger.  OPERATION/PROCEDURE: Reconstruction of flexor sheath of right middle finger using extensor retinaculum.  SURGEON: Nicki Reaper, M.D.  ASSISTANT: Artist Pais. Mina Marble, M.D.  ANESTHESIA: General.  ANESTHESIOLOGIST: Janetta Hora. Gelene Mink, M.D.  INDICATIONS FOR PROCEDURE: The patient is a 60 year old male, who suffered an injury to his right middle finger, with pain in the flexor sheath.  MRI reveals rupture of the flexor sheath.  DESCRIPTION OF PROCEDURE: The patient was brought to the operating room, where a general anesthetic was carried out without difficulty.  He was prepped and draped using Betadine scrubbing solution with the right arm free.  The limb was exsanguinated with an Esmarch bandage and the tourniquet placed high on the arm inflated to 250 mm Hg.  A volar Brunner incision was made over the proximal phalanx of the right middle finger and carried down through subcutaneous tissue.  Bleeders were electrocauterized.  Neurovascular structures were protected.  Dissection was carried down to the sheath, which was stretched.  A separate incision was then made over the wrist extensor surface transversely and carried down through subcutaneous tissue. Neurovascular structures were identified and protected.  Dissection was carried down to the extensor retinaculum.  A strip of extensor retinaculum approximately 1-1.5 cm wide was taken from the sixth to the first dorsal compartment.  This was removed as one complete strip.  The area was irrigated. The subcutaneous  tissue was closed with interrupted 4-0 Vicryl suture.  The interval between the extensor hood and proximal phalanx was then elevated with a right angle hemostat.  The retinaculum was then passed around the dorsal aspect of the proximal phalanx.  This was then snugged to the remaining flexor sheath and sutured together with figure-of-eight 4-0 Mersilene sutures.  This was done with the finger in full extension.  The pants-over-vest repair was performed with the retinaculum with the sutures on both margins of the repair. The report was then sutured to the remaining flexor sheath.  A 3.5 K-wire was passed into the proximal interphalangeal joint, pinning it in full extension. The finger was placed with full flexion and extension out prior to placement of the pin to be certain that the flexor tendons were not involved or caught with any of the sutures.  They were not.  The pin was bent and cut short.  The skin was closed with interrupted 5-0 nylon sutures after irrigation.  The dorsal wound was closed with a subcuticular 4-0 Monocryl.  Steri-Strips were applied over Benzoin.  A sterile compressive dressing and splint were applied. The patient tolerated the procedure well and was taken to the recovery room for observation in satisfactory condition.  He is discharged home to return to the [facility] in one week, on Vicodin and Keflex. Dictated by:   Nicki Reaper, M.D. Attending:  Nicki Reaper, M.D. DD:  01/23/01 TD:  01/24/01 Job: [id number]

## 2010-08-14 NOTE — Op Note (Signed)
[facility]  Patient:    Jacob Romero, Jacob Romero                      MRN: [id number] Proc. Date: 09/08/99 Adm. Date:  [id number] Disc. Date: [id number] Attending:  Thermon Leyland                           Operative Report  PREOPERATIVE DIAGNOSIS: 1. History of right ureteral stone. 2. Bilateral double-J indwelling stents.  POSTOPERATIVE DIAGNOSIS: 1. History of right ureteral stone. 2. Bilateral double-J indwelling stents.  OPERATION PERFORMED:  Cystoscopy, right retrograde pyelogram and double-J stent removal, bilateral.  SURGEON:  Barron Alvine, M.D.  ANESTHESIA:  General LMA.  INDICATIONS FOR PROCEDURE:  The patient is a 60 year old male.  He had a large stone in the right ureter that was treated with successful holmium laser lithotripsy.  On his imaging studies, there was also question of a left-sided stone.  He was having intermittent flank pain on the left and therefore, complete ureteroscopy was done on the left system as well.  No stone was seen. Bilateral double-J stents were left indwelling.  The patient had a great deal of difficulty in the past with removal of double-J stents with regard to pain and therefore, he requested that this be done under an anesthetic.  DESCRIPTION OF PROCEDURE:   The patient was brought to the operating room where he had successful induction of general anesthesia.  He was placed in lithotomy position and was prepped and draped in the usual manner. Cystoscopy revealed some small clots in his bladder.  Both stents were indwelling and in good position.  Both stents were removed with a grasper.  A right-sided retrograde pyelogram was performed.  I saw no obvious large fragments and there was no evidence of obstruction of the ureter.  The contrast drained very nicely from the right system.  Lidocaine jelly was instilled in his penis.  He was given some Toradol in the operating room. He was brought to the  recovery room in stable condition. DD:  09/08/99 TD:  09/11/99 Job: [id number]

## 2010-08-14 NOTE — Cardiovascular Report (Signed)
NAME:  Jacob Romero, Jacob Romero NO.:  [id number]   MEDICAL RECORD NO.:  [id number]          PATIENT TYPE:  AMB   LOCATION:  SDS                          FACILITY:  [facility]   PHYSICIAN:  Nanetta Batty, M.D.   DATE OF BIRTH:  03-31-1950   DATE OF PROCEDURE:  DATE OF DISCHARGE:                            CARDIAC CATHETERIZATION   PROCEDURE:  Peripheral angiogram.   [name] is a 60 year old, mildly overweight married white male,  history PVD status post fox hollow atherectomy with stenting using  IntraCoil's 05/27/2003 for claudication.  He has had recurrent  claudication with Doppler's recently performed that showed occlusion of  his popliteal stented segment.  He presents now for angiography,  potential endovascular reinnervation.   DESCRIPTION OF PROCEDURE:  The patient brought to the second floor of  [facility] angiographic suite in the postabsorptive state.  He is  premedicated with p.o. Valium, IV fentanyl and Versed.  His left groin  was prepped and shaved in usual sterile fashion.  One percent Xylocaine  was used for local anesthesia.  A 5-French sheath was inserted to the  right femoral artery using standard Seldinger technique.  5-French  tennis racket catheter, crossover and long end-hole catheters were used  for abdominal aortography with bifemoral runoff using bolus chase  digital subtraction step table technique.  A long end-hole was placed  into the right SFA for precise visualization of the stented segment.  Visipaque dye was used entirety of the case.  Aortic pressures monitored  through the case.   ANGIOGRAPHIC RESULTS:  1. Abdominal aorta;      a.     Renal arteries - normal.      b.     Infrarenal abdominal aorta - normal.  2. Left lower extremity; normal three-vessel runoff.  3. Right lower extremity; occluded popliteal within the stented      segment with reconstitution of the below the knee pop by      collaterals.  There was three-vessel  runoff.   IMPRESSION:  [name] has an occluded popliteal stented segment.  I do  not think that fox hollow atherectomy or PTA would provide a good long-  term result.  There is a landing zone for a below-the-knee fem-pop  bypass graft.  The sheath was removed and pressure was applied to  achieve hemostasis.  The patient left the suite in stable  condition.  He will be hydrated, discharged home today and will see me  back in the office in 1 week for follow-up.  It should be noted the  patient did have a vagal reaction during the case became bradycardic and  hypertensive which responded nicely to administration of intravenous  atropine and fluid resuscitation.      Nanetta Batty, M.D.  Electronically Signed     JB/MEDQ  D:  07/11/2006  T:  07/11/2006  Job:  [id number]   cc:   Angiographic Suite  [facility]  [place]

## 2010-08-14 NOTE — Procedures (Signed)
BYPASS GRAFT EVALUATION   INDICATION:  Followup of right lower extremity bypass graft.  Patient  denies claudication and rest pain.   HISTORY:  Diabetes:  No.  Cardiac:  No.  Hypertension:  Yes.  Smoking:  Quit.  Previous Surgery:  Right AK pop-BK pop BPG with saphenous vein on  08/29/06 by Dr. Madilyn Fireman.  Patient has history of occluded right popliteal  stents.   SINGLE LEVEL ARTERIAL EXAM                               RIGHT              LEFT  Brachial:                    126                130  Anterior tibial:             132                136  Posterior tibial:            140                140  Peroneal:  Ankle/brachial index:        >1.0               >1.0   PREVIOUS ABI:  Date: 04/19/07  RIGHT:  >1.0  LEFT:  >1.0   LOWER EXTREMITY BYPASS GRAFT DUPLEX EXAM:   DUPLEX:  1. Patent right CFA and SFA without evidence of stenoses.  2. Patent right AK-BK pop BPG with velocities ranging from 61 cm/s -      98 cm/s without evidence of stenosis.  3. Patent distal native artery.   IMPRESSION:  1. Stable ankle brachial indices bilaterally.  2. Patent right common femoral artery and superficial femoral artery      without evidence of stenoses.  3. Patent right above-knee/below-knee popliteal bypass graft without      evidence of stenosis.  4. Patent distal native artery.   ___________________________________________  P. Liliane Bade, M.D.   PB/MEDQ  D:  07/20/2007  T:  07/20/2007  Job:  [id number]

## 2010-08-14 NOTE — Cardiovascular Report (Signed)
NAME:  Jacob Romero, Jacob Romero                         ACCOUNT NO.:  [id number]   MEDICAL RECORD NO.:  [id number]                   PATIENT TYPE:  OIB   LOCATION:  6526                                 FACILITY:  [facility]   PHYSICIAN:  Nanetta Batty, M.D.                DATE OF BIRTH:  [birth year]   DATE OF PROCEDURE:  04/15/2003  DATE OF DISCHARGE:  04/16/2003                              CARDIAC CATHETERIZATION   INDICATIONS:  Jacob Romero is a 60 year old white male with history of  progressive right lower extremity claudication.  His other problems include  hypertension.  Dopplers revealed diminished right ankle brachial index.  Cardiolite stress test was normal.  He presents now for angiography and  potential intervention.   PROCEDURE DESCRIPTION:  The patient was brought to the sixth floor [facility] Angiographic Suite in the postabsorptive state.  He  was premedicated with p.o. Valium and IV Versed.  His left groin was prepped  and shaved in the usual sterile fashion.  1% Xylocaine was used for local  anesthesia.  A 5 French sheath was inserted into the left femoral artery  using standard Seldinger technique. A 5 French tennis racquet catheter was  used for midstream and distal abdominal aortography as well as bifemoral run  off.  Visipaque dye was used for the entirety of the case.  Retrograde  aortic pressure was monitored during the case.   ANGIOGRAPHIC RESULTS:  1. Abdominal aorta:     A. Renal arteries - normal.     B. Infrarenal abdominal aorta - normal.  2. Left lower extremity:  Normal three vessel runoff.  3. Right lower extremity:  Short segment total occlusion of the right     popliteal with reconstitution by __________collaterals and three vessel     runoff.   IMPRESSION:  Jacob Romero has short segment popliteal occlusion and has  symptomatic claudication.  Will proceed with attempted PTA.   PROCEDURE DESCRIPTION:  Contralateral access was obtained with  a short 5  [place] IMA catheter and an 0.035 Wholey wire as well as a 6 [place] Terumo  sheath.  The patient received 3000 units of heparin intravenously.  A long 5  French end-hole catheter was then advanced to the point of occlusion and a  0.035 glide wire was then manipulated across the stenosis.  PTA was  performed with a 4 x 4 Powerflex, 5 x 6 Powerflex and 6 x 4 Powerflex  resulting in reconstitution of antegrade flow.  There was dissection noted  which did not appear to be flow-limiting.  Stenting was not performed  because this was at a flexion point.   OVERALL IMPRESSION:  Successful percutaneous transluminal angioplasty of  short segment popliteal occlusion for symptomatic claudication.  The patient  will be discharged home in the morning and followup Doppler study will be  obtained.  If he remains patent and still has claudication, he  would be a  candidate for fox hollow atherectomy.   Sheaths were removed and pressure was held on the groin to achieve  hemostasis.  The patient left the lab in stable condition.                                               Nanetta Batty, M.D.    JB/MEDQ  D:  04/15/2003  T:  04/16/2003  Job:  [id number]   cc:   [facility]  [address] [address]  [place], [place]   Brad Maisie Fus

## 2010-08-14 NOTE — Cardiovascular Report (Signed)
NAME:  Jacob Romero, Jacob Romero                         ACCOUNT NO.:  [id number]   MEDICAL RECORD NO.:  [id number]                   PATIENT TYPE:  OIB   LOCATION:  3735                                 FACILITY:  [facility]   PHYSICIAN:  Nanetta Batty, M.D.                DATE OF BIRTH:  Mar 26, 1951   DATE OF PROCEDURE:  05/27/2003  DATE OF DISCHARGE:  05/28/2003                              CARDIAC CATHETERIZATION   PROCEDURE:  Cardiac catheterization.   CARDIOLOGIST:  Nanetta Batty, M.D.   INDICATIONS:  Mr. Vuncannon is a 60 year old white male with a history of right  lower extremity claudication and angiographically documented occlusion of  his right popliteal with three vessel runoff. He underwent PTCA  approximately one month ago of long second occlusion which resulted in  eventual reocclusion.  He presents today for attempted followup atherectomy  plus/minus stenting using revascular coil self-expanding stent.   DESCRIPTION OF PROCEDURE:  The patient was brought to the sixth floor [facility] Suite in the post absorptive state.  He was premedication with p.o. Valium, IV Versed and Nubain.  His left groin  was prepped and shaved in the usual sterile fashion.  Then 1% Xylocaine was  used for local anesthesia.  A 5 French sheath was inserted into the left  femoral artery using standard Seldinger technique.  The 5 French short IMA  catheter and the Wooley wire were used for contralateral access.  A 7 [place]  dual lumen cross over sheath was then used to gain contralateral access.  An  0.35 Wooley wire was then advanced to the site of the occlusion.  A 5 French  end-hole catheter was then advanced over the [place].  This was exchanged for  a long 0.35 angled Glidewire.  The patient received a total of 8000 units of  heparin intravenously.  Omnipaque dye was used for the entirety of the case.  Retrograde aortic pressures were monitored to the end of the case.   Under  fluoroscopic and angiographic guidance, with the O.35 angle Glidewire I was  ultimately able to cross the lesion into the knee popliteal and tibial  vessels.  I was then able to exchange over a short 4.2 balloon for an OM-4  260 soft Asahi wire.  Using an ES upgraded to an LS __________ atherectomy  device, multiple passes were made across the lesion with the representative  present removing a large amount of atheromatous plaque.  There was  ultimately restoration of the antegrade flow, however, there did appear to  be a flow limiting dissection.  Because this was located behind the flexion  point of the knee, overlapping 6-4, 5-4 and 7-4 ED3 vascular coil stems were  deployed, post dilated with the 5-4 balloon.  The final angiographic result  was reduction of 100% occluded right popliteal to 0% residual.  There was  excellent flow.  The patient tolerated the  procedure well.  The  sheath was  removed after the ACT was checked.  Pressure was held on the groin to achieve hemostasis.  The patient left the  lab in stable condition.  Plan will be treatment with aspirin and Plavix.  He will be hydrated and discharged home in the morning.  I will obtain  followup Dopplers in 1-2 weeks after which he will be seen back in the  office.  He left the lab in stable condition.                                               Nanetta Batty, M.D.    Cordelia Pen  D:  05/27/2003  T:  05/28/2003  Job:  [id number]   cc:   [facility]  Peripheral Vascular Angiographic Suite   [facility]   [name]

## 2010-12-23 ENCOUNTER — Encounter: Payer: Self-pay | Admitting: Thoracic Diseases

## 2010-12-24 ENCOUNTER — Ambulatory Visit: Payer: Self-pay

## 2010-12-25 ENCOUNTER — Ambulatory Visit: Payer: BC Managed Care – PPO | Admitting: Thoracic Diseases

## 2010-12-25 ENCOUNTER — Encounter (INDEPENDENT_AMBULATORY_CARE_PROVIDER_SITE_OTHER): Payer: BC Managed Care – PPO | Admitting: *Deleted

## 2010-12-25 DIAGNOSIS — Z48812 Encounter for surgical aftercare following surgery on the circulatory system: Secondary | ICD-10-CM

## 2010-12-25 DIAGNOSIS — I739 Peripheral vascular disease, unspecified: Secondary | ICD-10-CM

## 2011-01-14 LAB — BASIC METABOLIC PANEL
Calcium: 8.5
Creatinine, Ser: 0.93
GFR calc Af Amer: >60
GFR calc non Af Amer: >60
Sodium: 137

## 2011-01-14 LAB — CBC
Hemoglobin: 14.1
RBC: 4.46
RDW: 13.9

## 2011-07-05 ENCOUNTER — Other Ambulatory Visit: Payer: Self-pay | Admitting: Vascular Surgery

## 2011-10-27 IMAGING — CT CT HEAD W/O CM
1 of 2 series · 13 of 30 positions shown, 17 images · non-contrast
Comparison: None.

CLINICAL DATA: Severe headache.

CT HEAD WITHOUT CONTRAST
TECHNIQUE: Contiguous axial images were obtained from the base of
the skull through the vertex without contrast.

[Series 2: brain · axial · 0.47mm/px · z∈[+93,+231]mm · 13 of 40 slices shown, 17 images]
[im 3/40  brain]
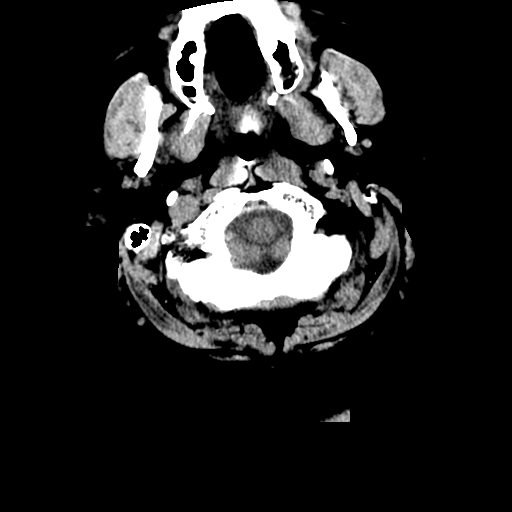
[im 3/40  bone]
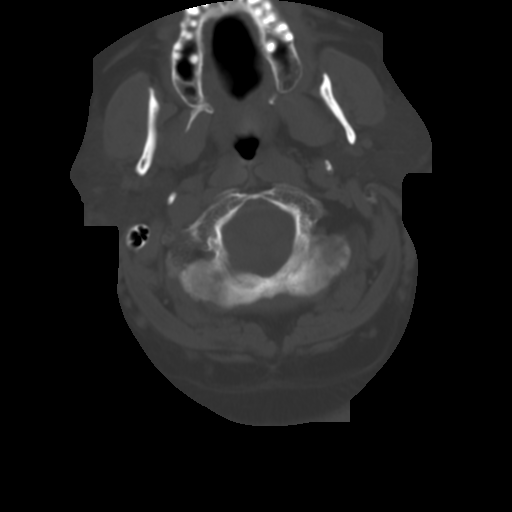
[im 6/40  brain]
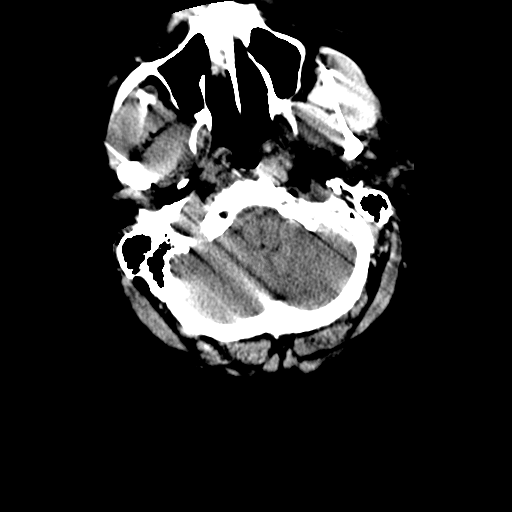
[im 9/40  brain]
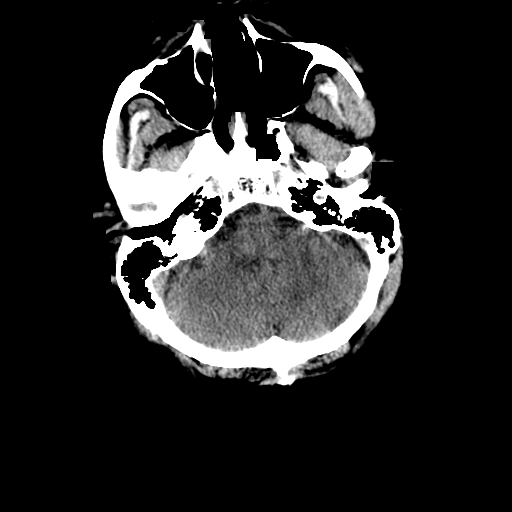
[im 12/40  brain]
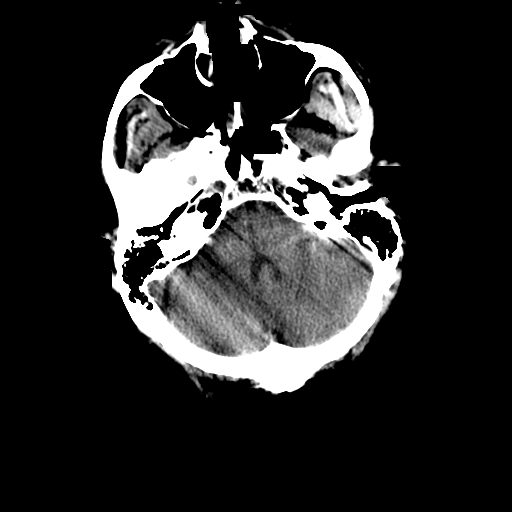
[im 14/40  brain]
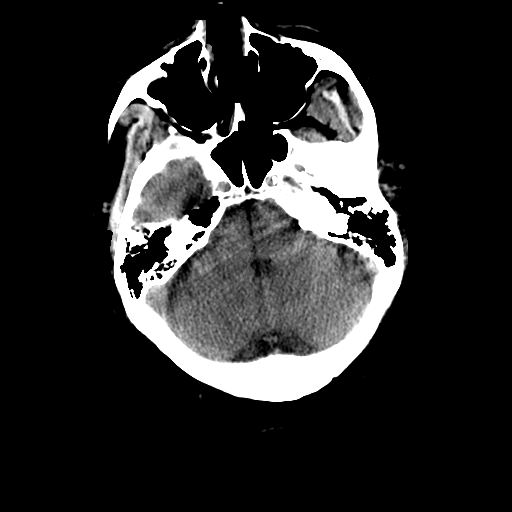
[im 14/40  bone]
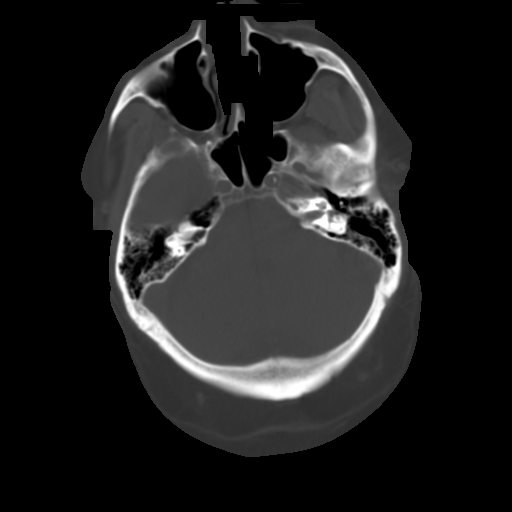
[im 17/40  brain]
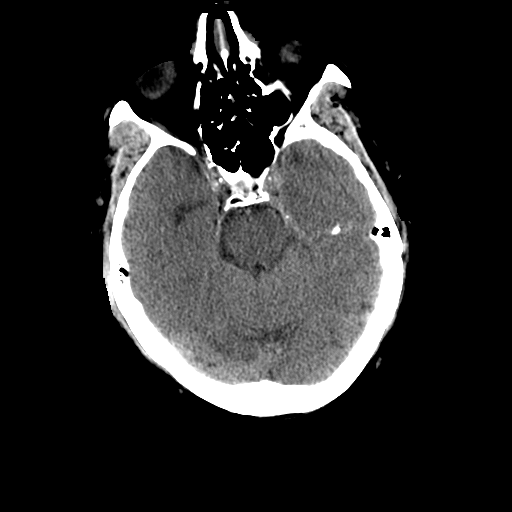
[im 20/40  brain]
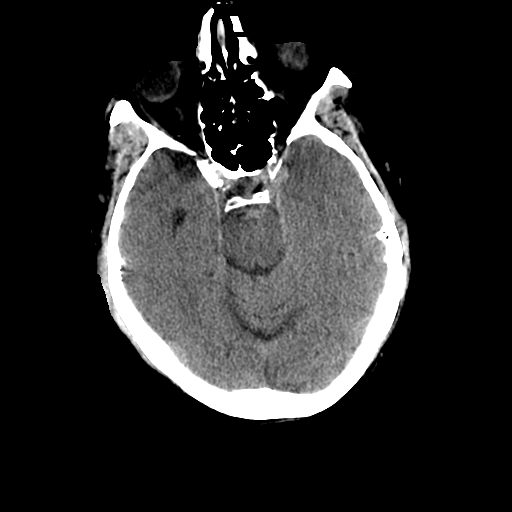
[im 23/40  brain]
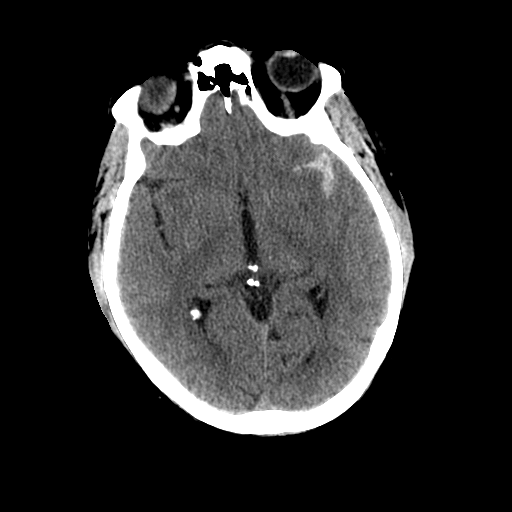
[im 26/40  brain]
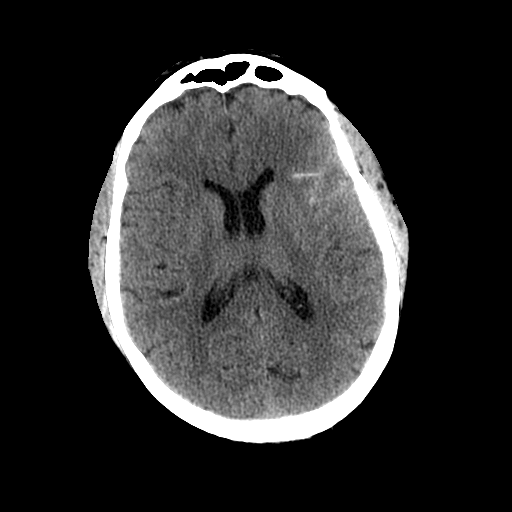
[im 26/40  bone]
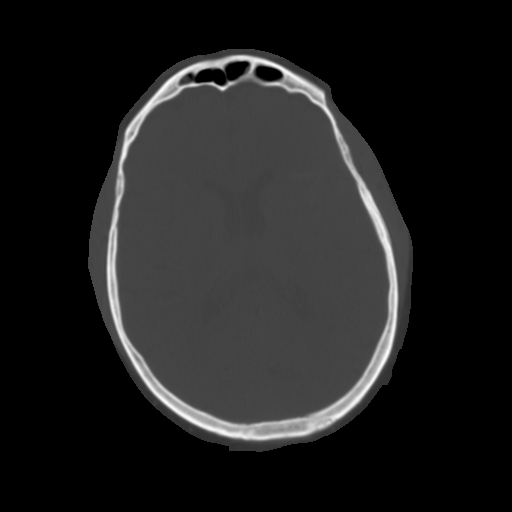
[im 28/40  brain]
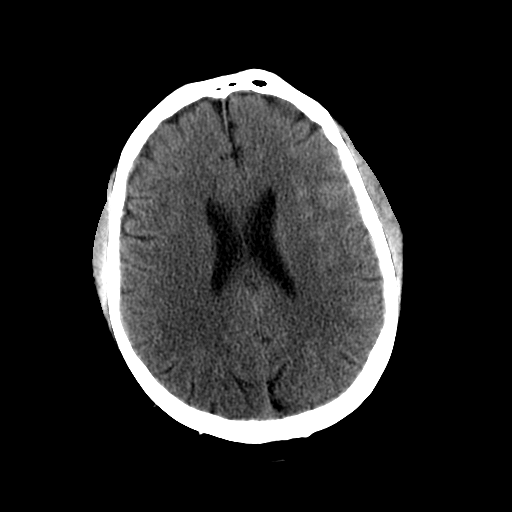
[im 31/40  brain]
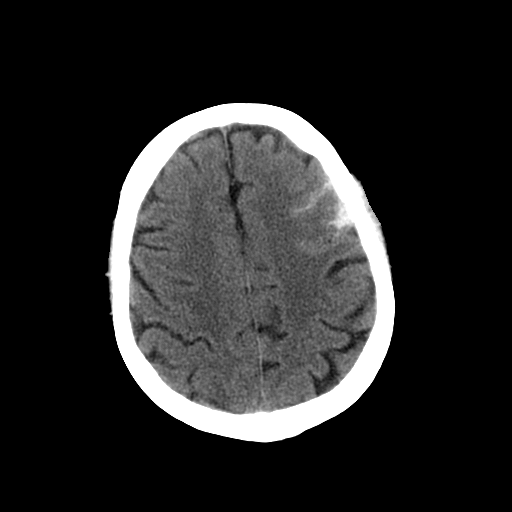
[im 34/40  brain]
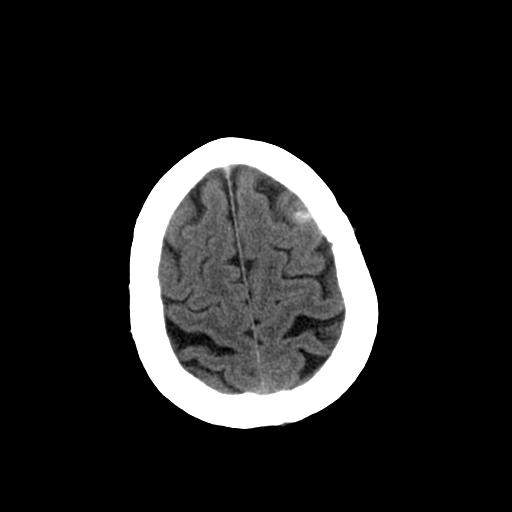
[im 37/40  brain]
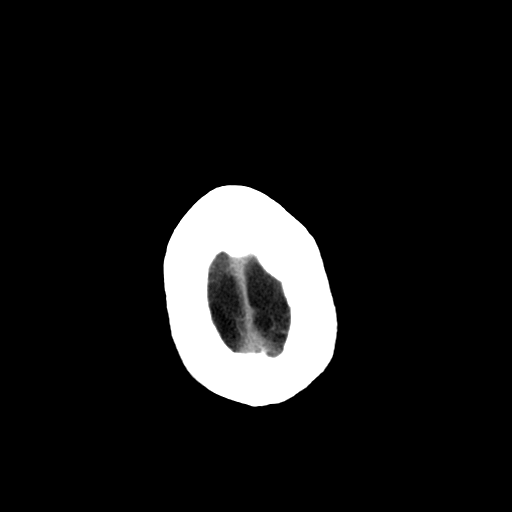
[im 37/40  bone]
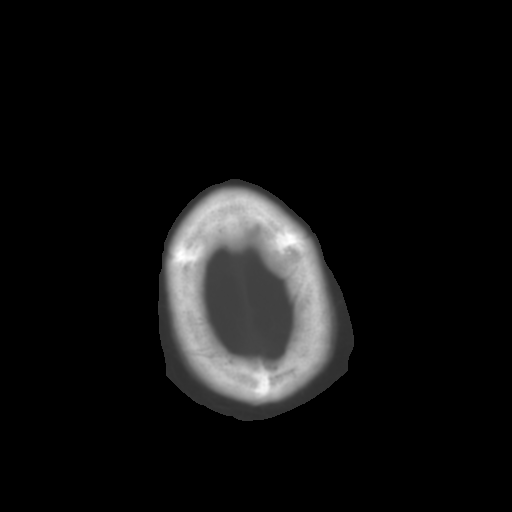

[13 of 30 positions shown; findings below may reference images not displayed]

FINDINGS: There is scattered subarachnoid hemorrhage on the left
predominantly over the frontal lobe with blood also seen in the
Sylvian fissure.  No acute infarction, hydrocephalus, midline shift
or subdural blood is identified.  No intraventricular hemorrhage is
seen.  Calvarium intact.
IMPRESSION: Left side subarachnoid hemorrhage. Critical test results telephoned
to Dilcio Kimball at the time of interpretation on 05/17/2009 at [DATE].

## 2011-10-27 IMAGING — CT CT HEAD W/O CM
1 of 2 series · 13 of 30 positions shown, 17 images · non-contrast
Comparison: Head CT dated 05/17/2009 at [DATE].

CLINICAL DATA: Follow up intracranial hemorrhage.

CT HEAD WITHOUT CONTRAST
TECHNIQUE: Contiguous axial images were obtained from the base of
the skull through the vertex without contrast.

[Series 2: brain · axial · 0.47mm/px · z∈[+98,+246]mm · 13 of 32 slices shown, 17 images]
[im 3/32  brain]
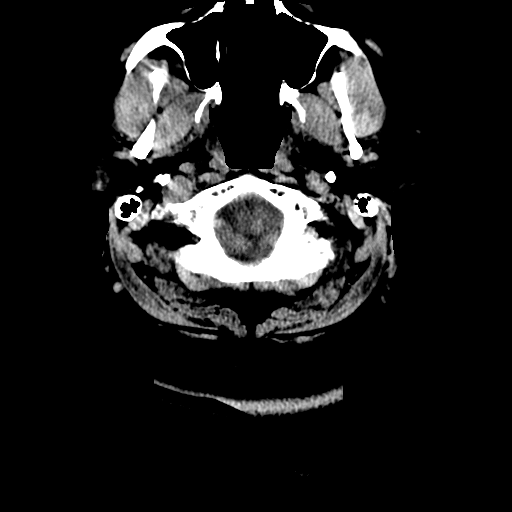
[im 3/32  bone]
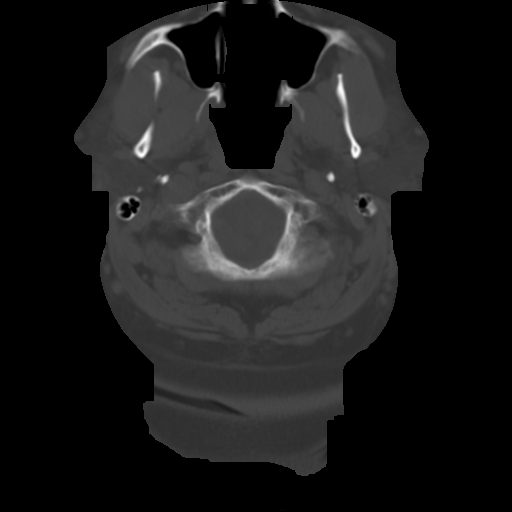
[im 5/32  brain]
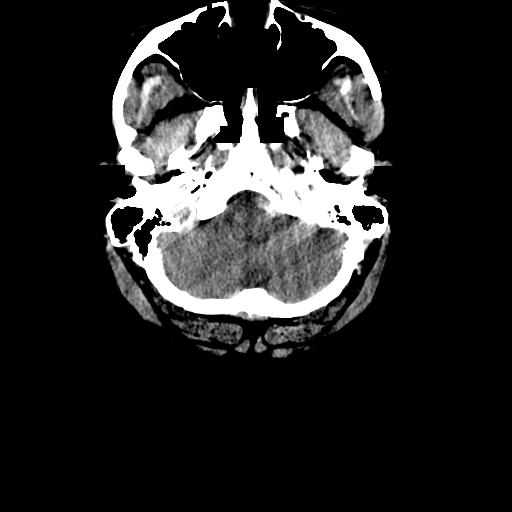
[im 7/32  brain]
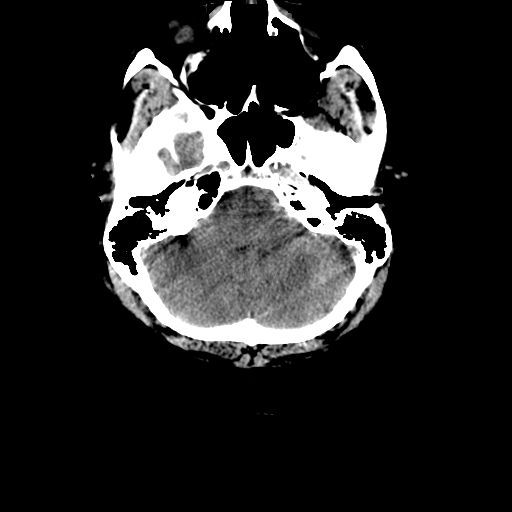
[im 9/32  brain]
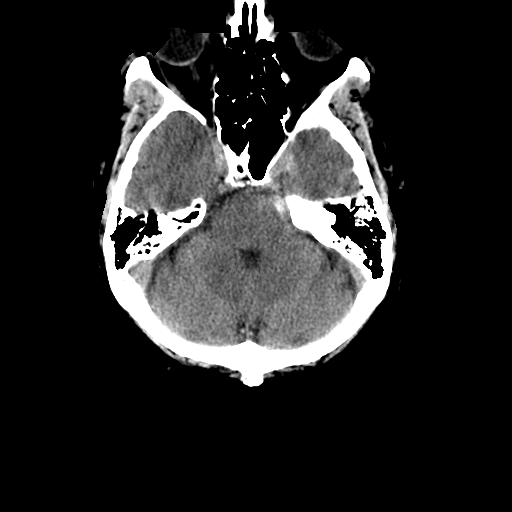
[im 12/32  brain]
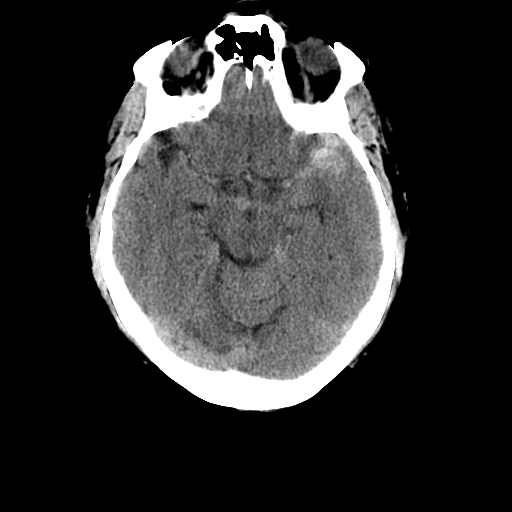
[im 12/32  bone]
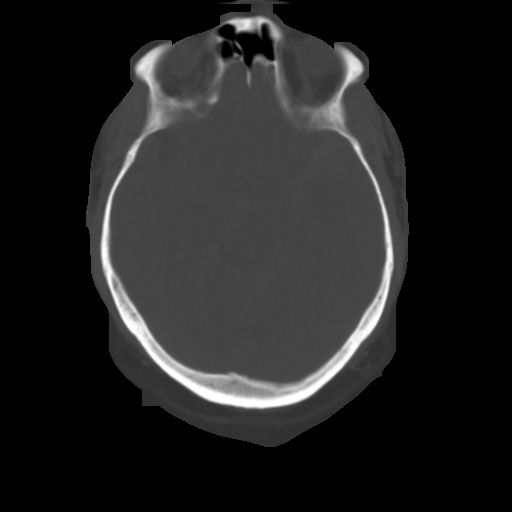
[im 14/32  brain]
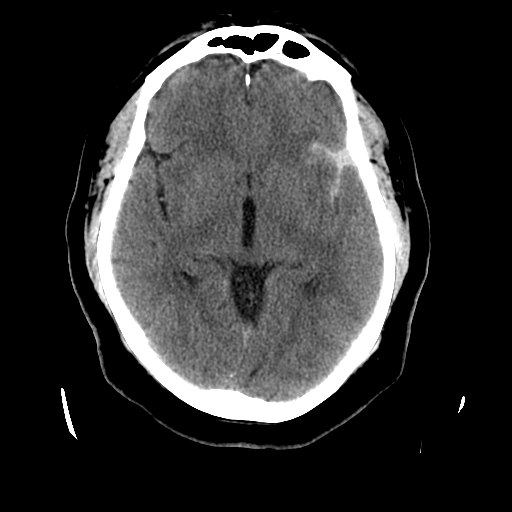
[im 16/32  brain]
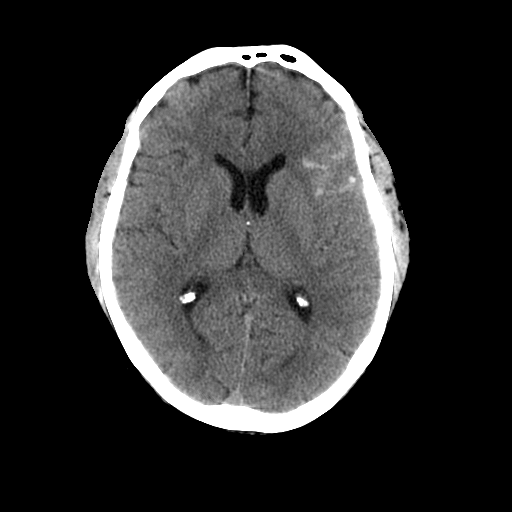
[im 18/32  brain]
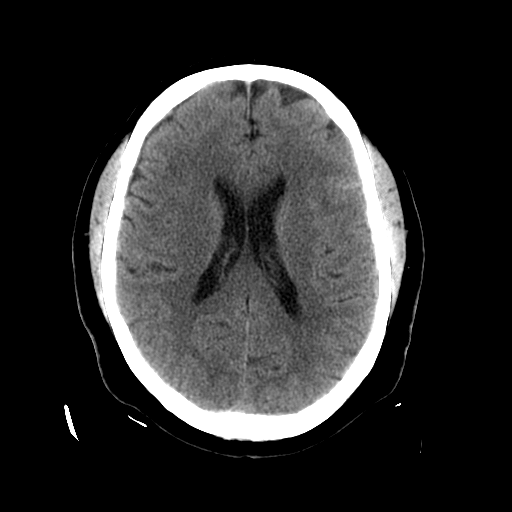
[im 20/32  brain]
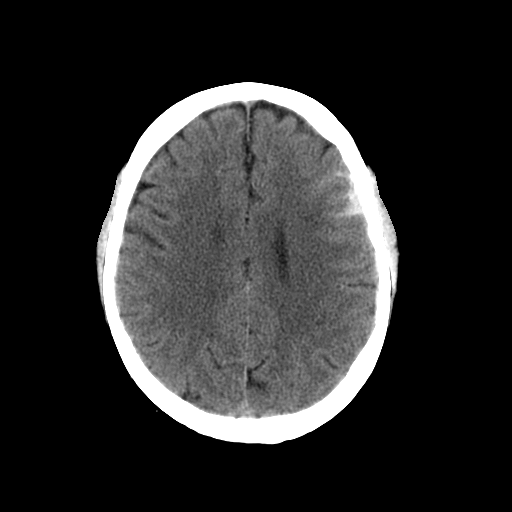
[im 20/32  bone]
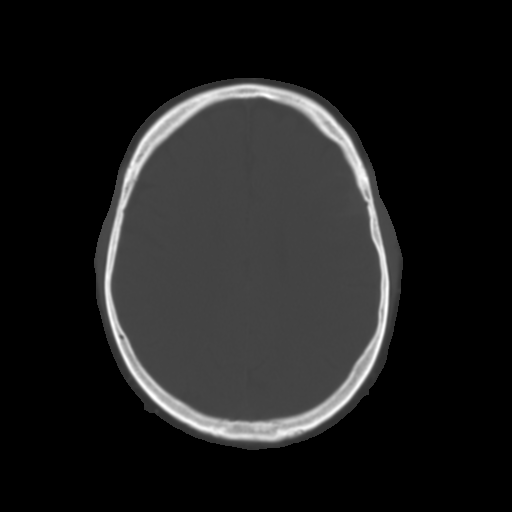
[im 23/32  brain]
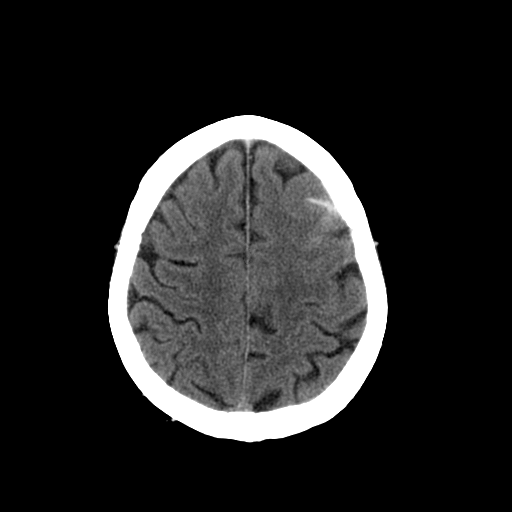
[im 25/32  brain]
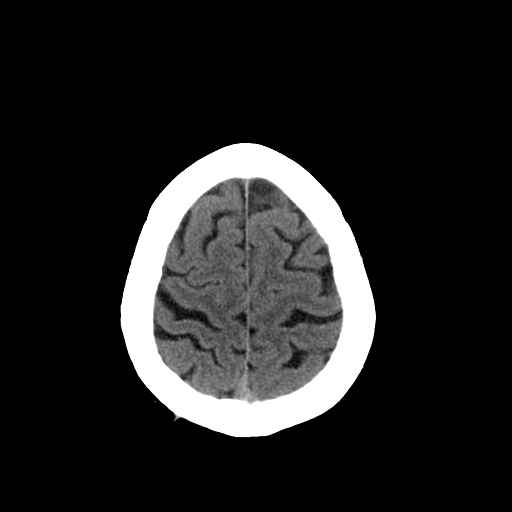
[im 27/32  brain]
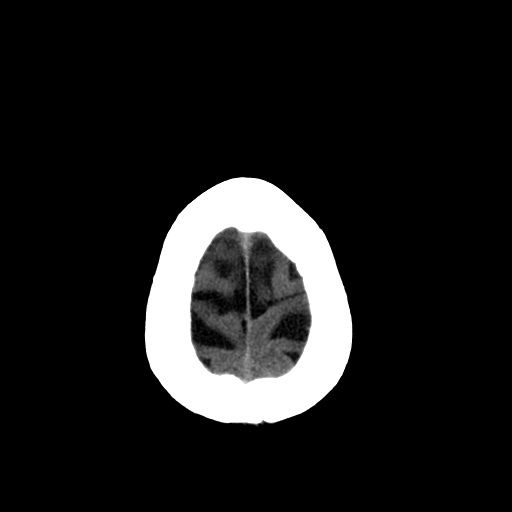
[im 29/32  brain]
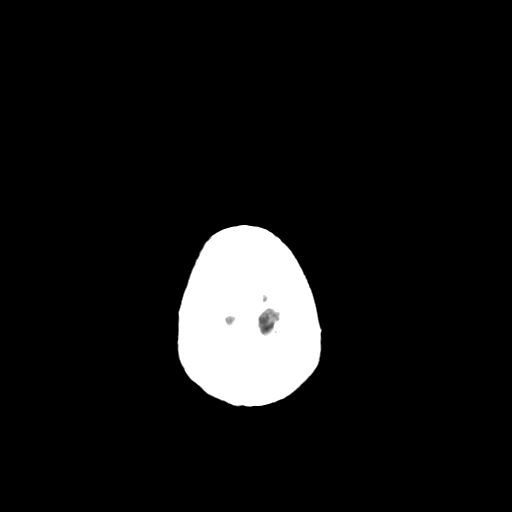
[im 29/32  bone]
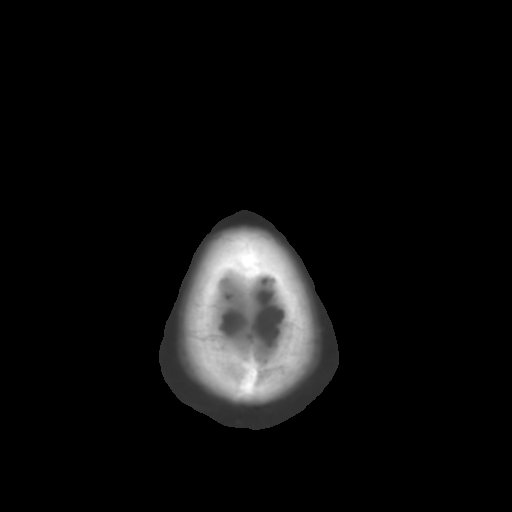

[13 of 30 positions shown; findings below may reference images not displayed]

FINDINGS: There is increased subarachnoid blood involving the left
ambient cistern and interpeduncular fossa.  Again seen is
subarachnoid blood in the left sylvian fissure and left frontal
lobe. No significant change in the size of the ventricles.  No
acute bony abnormality. No evidence for a large mass lesion or
midline shift.
IMPRESSION: Increased subarachnoid blood.  No evidence for hydrocephalus.

## 2011-10-28 ENCOUNTER — Encounter: Payer: Self-pay | Admitting: Vascular Surgery

## 2012-10-23 ENCOUNTER — Telehealth (HOSPITAL_COMMUNITY): Payer: Self-pay | Admitting: Cardiovascular Disease

## 2012-10-23 ENCOUNTER — Other Ambulatory Visit (HOSPITAL_COMMUNITY): Payer: Self-pay | Admitting: Cardiovascular Disease

## 2012-10-23 DIAGNOSIS — I739 Peripheral vascular disease, unspecified: Secondary | ICD-10-CM

## 2012-10-26 ENCOUNTER — Encounter: Payer: Self-pay | Admitting: Cardiology

## 2012-10-26 DIAGNOSIS — E785 Hyperlipidemia, unspecified: Secondary | ICD-10-CM | POA: Insufficient documentation

## 2012-10-26 DIAGNOSIS — I2510 Atherosclerotic heart disease of native coronary artery without angina pectoris: Secondary | ICD-10-CM | POA: Insufficient documentation

## 2012-10-26 DIAGNOSIS — F329 Major depressive disorder, single episode, unspecified: Secondary | ICD-10-CM | POA: Insufficient documentation

## 2012-10-26 DIAGNOSIS — G4730 Sleep apnea, unspecified: Secondary | ICD-10-CM | POA: Insufficient documentation

## 2012-10-26 DIAGNOSIS — I10 Essential (primary) hypertension: Secondary | ICD-10-CM | POA: Insufficient documentation

## 2012-10-26 DIAGNOSIS — F32A Depression, unspecified: Secondary | ICD-10-CM

## 2012-10-26 DIAGNOSIS — K219 Gastro-esophageal reflux disease without esophagitis: Secondary | ICD-10-CM | POA: Insufficient documentation

## 2012-10-26 DIAGNOSIS — I739 Peripheral vascular disease, unspecified: Secondary | ICD-10-CM | POA: Insufficient documentation

## 2012-10-27 ENCOUNTER — Encounter (HOSPITAL_COMMUNITY): Payer: Self-pay | Admitting: Cardiovascular Disease

## 2012-10-27 ENCOUNTER — Ambulatory Visit (HOSPITAL_COMMUNITY)
Admission: RE | Admit: 2012-10-27 | Discharge: 2012-10-27 | Disposition: A | Discharge status: Home / Self Care | Payer: 59 | Source: Ambulatory Visit | Attending: Cardiovascular Disease | Admitting: Cardiovascular Disease

## 2012-10-27 ENCOUNTER — Encounter: Payer: Self-pay | Admitting: Cardiovascular Disease

## 2012-10-27 ENCOUNTER — Ambulatory Visit (INDEPENDENT_AMBULATORY_CARE_PROVIDER_SITE_OTHER): Payer: 59 | Admitting: Cardiovascular Disease

## 2012-10-27 VITALS — BP 138/78 | HR 81 | Ht 68.5 in | Wt 290.5 lb

## 2012-10-27 DIAGNOSIS — R0602 Shortness of breath: Secondary | ICD-10-CM

## 2012-10-27 DIAGNOSIS — I70219 Atherosclerosis of native arteries of extremities with intermittent claudication, unspecified extremity: Secondary | ICD-10-CM

## 2012-10-27 DIAGNOSIS — R0609 Other forms of dyspnea: Secondary | ICD-10-CM

## 2012-10-27 DIAGNOSIS — I10 Essential (primary) hypertension: Secondary | ICD-10-CM

## 2012-10-27 DIAGNOSIS — I2510 Atherosclerotic heart disease of native coronary artery without angina pectoris: Secondary | ICD-10-CM

## 2012-10-27 DIAGNOSIS — Z79899 Other long term (current) drug therapy: Secondary | ICD-10-CM

## 2012-10-27 DIAGNOSIS — I739 Peripheral vascular disease, unspecified: Secondary | ICD-10-CM | POA: Insufficient documentation

## 2012-10-27 LAB — BASIC METABOLIC PANEL
BUN: 15 mg/dL (ref 6–23)
Chloride: 98 mEq/L (ref 96–112)
Glucose, Bld: 77 mg/dL (ref 70–99)
Potassium: 4.3 mEq/L (ref 3.5–5.3)
Sodium: 138 mEq/L (ref 135–145)

## 2012-10-27 LAB — CBC
HCT: 50.4 % (ref 39.0–52.0)
MCH: 25.8 pg — ABNORMAL LOW (ref 26.0–34.0)
MCV: 79.4 fL (ref 78.0–100.0)
Platelets: 264 10*3/uL (ref 150–400)
RBC: 6.35 MIL/uL — ABNORMAL HIGH (ref 4.22–5.81)
WBC: 8.5 10*3/uL (ref 4.0–10.5)

## 2012-10-27 MED ORDER — FUROSEMIDE 20 MG PO TABS: 20.0000 mg | ORAL_TABLET | Freq: Every day | ORAL | Status: DC

## 2012-10-27 NOTE — Progress Notes (Signed)
10/27/2012 Jacob Romero   1950-12-15  [id number]  Primary Physician Robb Matar, MD Primary Cardiologist: Runell Gess MD [name]   HPI:  The patient is a very pleasant 62 year old severely overweight married Caucasian male father of 2, grandfather of 2 grandchildren who I saw 6 months ago and who is accompanied by his wife today. He has a history remarkable for hypertension, hyperlipidemia and known CAD. He does have sleep apnea on CPAP. He had above-to-below the knee popliteal bypass grafting by Dr. Liliane Bade after an occluded distal right SFA stent which he is now asymptomatic from and which we get annual Dopplers. I cathed him May 15, 2009 via the right radial approach revealing a high-grade mid RCA stenosis with a totally occluded PLA. There was an anterior takeoff. I attempted recanalization, however, he developed a subarachnoid hemorrhage and the procedure was aborted. Dr. Tresa Endo brought him back and performed high-speed rotational atherectomy, PCI and stenting of his mid RCA but was unable to recanalize the PLA branch. He is completely asymptomatic with regards to this. He has got an excellent lipid profile for secondary prevention, followed closely by Dr. Samuel Germany. His total cholesterol is 140, LDL of 74 and HDL of 45.  I saw him back in the office 05/05/12 at which time he was currently stable. For the last several months he developed progressive dyspnea on exertion fatigue and lower extreme edema. The dyspnea was similar to his symptoms prior to intervention in the past. I'm going to get a 2-D echocardiogram, a pharmacologic Myoview stress test and begin him on low-dose diuretics. I'll see him back after that      Current Outpatient Prescriptions  Medication Sig Dispense Refill  . Acetaminophen-Caffeine (EXCEDRIN TENSION HEADACHE PO) Take 2 tablets by mouth as needed.        [name] ADVAIR DISKUS 250-50 MCG/DOSE AEPB Inhale 1 puff into the lungs 2 (two) times daily.       [name] albuterol (PROVENTIL) (2.5 MG/3ML) 0.083% nebulizer solution Take 2.5 mg by nebulization every 6 (six) hours as needed for wheezing.      . ANDROGEL PUMP 20.25 MG/ACT (1.62%) GEL 3 application daily.      [name] aspirin EC 81 MG tablet Take 81 mg by mouth daily.        . cetirizine (ZYRTEC) 10 MG tablet Take 10 mg by mouth as needed for allergies.      [name] clopidogrel (PLAVIX) 75 MG tablet Take 75 mg by mouth daily.        . DULoxetine (CYMBALTA) 30 MG capsule Take 1 capsule by mouth daily.      [name] ezetimibe (ZETIA) 10 MG tablet Take 10 mg by mouth daily.        . Fiber CAPS Take by mouth.        . fish oil-omega-3 fatty acids 1000 MG capsule Take 2 g by mouth daily.        [name] lisinopril-hydrochlorothiazide (PRINZIDE,ZESTORETIC) 20-12.5 MG per tablet Take 2 tablets by mouth.       . meloxicam (MOBIC) 7.5 MG tablet Take 7.5 mg by mouth daily.      . metoprolol (LOPRESSOR) 50 MG tablet Take 50 mg by mouth daily.      . montelukast (SINGULAIR) 10 MG tablet Take 1 tablet by mouth daily.      . Multiple Vitamin (MULTIVITAMIN PO) Take by mouth.        . nitroGLYCERIN (NITROSTAT) 0.4 MG SL tablet Place 0.4 mg under  the tongue every 5 (five) minutes as needed for chest pain.      [name] omeprazole (PRILOSEC) 20 MG capsule Take 20 mg by mouth daily.        . pantoprazole (PROTONIX) 40 MG tablet Take 1 tablet by mouth daily.       No current facility-administered medications for this visit.    Allergies  Allergen Reactions  . Statins     Unable to tolerate high dose statins    History   Social History  . Marital Status: Married    Spouse Name: N/A    Number of Children: N/A  . Years of Education: N/A   Occupational History  . Not on file.   Social History Main Topics  . Smoking status: Former Smoker    Types: Cigarettes    Quit date: 06/01/1991  . Smokeless tobacco: Not on file  . Alcohol Use: No  . Drug Use:   . Sexually Active:    Other Topics Concern  . Not on file   Social History  Narrative  . No narrative on file     Review of Systems: General: negative for chills, fever, night sweats or weight changes.  Cardiovascular: negative for chest pain, dyspnea on exertion, edema, orthopnea, palpitations, paroxysmal nocturnal dyspnea or shortness of breath Dermatological: negative for rash Respiratory: negative for cough or wheezing Urologic: negative for hematuria Abdominal: negative for nausea, vomiting, diarrhea, bright red blood per rectum, melena, or hematemesis Neurologic: negative for visual changes, syncope, or dizziness All other systems reviewed and are otherwise negative except as noted above.    Blood pressure 138/78, pulse 81, height 5' 8.5" (1.74 m), weight 290 lb 8 oz (131.77 kg).  General appearance: alert and no distress Neck: no adenopathy, no carotid bruit, no JVD, supple, symmetrical, trachea midline and thyroid not enlarged, symmetric, no tenderness/mass/nodules Lungs: clear to auscultation bilaterally Heart: regular rate and rhythm, S1, S2 normal, no murmur, click, rub or gallop Extremities: 2+ right lower extreme edema 1+ left lower extremity edema  EKG normal sinus rhythm 81 without ST or T wave changes  ASSESSMENT AND PLAN:   CAD (coronary artery disease) History of high-speed rotational atherectomy, PCI and stenting of the mid dominant RCA. This was done by Dr. Norwood Levo in 2011. He was unable to cross the posterior lateral branch. The patient had normal LV function. His symptoms prior to the procedure were dyspnea on exertion and fatigue. He did have a Myoview that preceded his intervention that showed subtle anterior ischemia I saw him 4-5 months ago and he was stable. Her last to 3 months he developed progressive dyspnea as well as lower extremity edema. I'm going to get a 2-D echo and Lexiscan  Myoview on him. I am going to start him on low-dose Lasix and check routine labs. I will see her back after that for further  evaluation.      Runell Gess MD FACP,FACC,FAHA, [facility] 10/27/2012 3:49 PM

## 2012-10-27 NOTE — Assessment & Plan Note (Signed)
History of high-speed rotational atherectomy, PCI and stenting of the mid dominant RCA. This was done by Dr. Norwood Levo in 2011. He was unable to cross the posterior lateral branch. The patient had normal LV function. His symptoms prior to the procedure were dyspnea on exertion and fatigue. He did have a Myoview that preceded his intervention that showed subtle anterior ischemia I saw him 4-5 months ago and he was stable. Her last to 3 months he developed progressive dyspnea as well as lower extremity edema. I'm going to get a 2-D echo and Lexiscan  Myoview on him. I am going to start him on low-dose Lasix and check routine labs. I will see her back after that for further evaluation.

## 2012-10-27 NOTE — Patient Instructions (Addendum)
  We will see you back in follow up in 2 weeks  Dr Allyson Sabal has ordered an echocardiogram and a lexiscan myoview  Bloodwork to be done today or early next week  Start Lasix 20mg  daily.  This medication has been sent to your pharmacy.

## 2012-10-27 NOTE — Progress Notes (Signed)
Arterial Duplex Lower Ext. Completed. Leanza Shepperson, BS, RDMS, RVT  

## 2012-10-31 ENCOUNTER — Telehealth: Payer: Self-pay | Admitting: Cardiovascular Disease

## 2012-10-31 ENCOUNTER — Encounter: Payer: Self-pay | Admitting: *Deleted

## 2012-10-31 DIAGNOSIS — M7989 Other specified soft tissue disorders: Secondary | ICD-10-CM

## 2012-10-31 DIAGNOSIS — R600 Localized edema: Secondary | ICD-10-CM

## 2012-10-31 NOTE — Telephone Encounter (Signed)
Returned call and informed pt per instructions by MD/PA.  Pt verbalized understanding and agreed w/ plan.  Pt will get fitted after stress test tomorrow.  Scheduling contacted to set up appt.

## 2012-10-31 NOTE — Telephone Encounter (Signed)
Tom have been on the Lasix for the last 4 days-no different in urination and still swelling.

## 2012-10-31 NOTE — Telephone Encounter (Signed)
Returned call.  Left message to call back before 4pm.  

## 2012-10-31 NOTE — Telephone Encounter (Signed)
Increase lasix to 40mg  daily.  The patient needs to weigh daily in the morning immediately after getting up.  Limit sodium intake to less than 2000mg  daily.   Compression hose  Would be a good idea.  Order written.  Jamonta Goerner 4:47 PM

## 2012-10-31 NOTE — Telephone Encounter (Signed)
Harriett Sine, pt's wife, called back.  Stated pt was started on generic Lasix on Friday.  Stated pt has been taking it for 4 days and there has been no change in swelling.  Stated pt is at work today and is on his feet a lot.  Stated swelling is worse.  Denied pt having compression stockings.  Denied change in SOB and stated it is about the same.  Stated pt is coming in tomorrow for a chemical stress test.  Pt informed MD/PA will be notified for further instructions.  Pt verbalized understanding and agreed w/ plan.  Message forwarded to Dr. Allyson Sabal for review and further instructions

## 2012-10-31 NOTE — Telephone Encounter (Signed)
No response from Dr. Allyson Sabal yet.  Message forwarded to B. Leron Croak, PA-C for further instructions.

## 2012-11-01 ENCOUNTER — Ambulatory Visit (HOSPITAL_COMMUNITY)
Admission: RE | Admit: 2012-11-01 | Discharge: 2012-11-01 | Disposition: A | Discharge status: Home / Self Care | Payer: 59 | Source: Ambulatory Visit | Attending: Cardiovascular Disease | Admitting: Cardiovascular Disease

## 2012-11-01 ENCOUNTER — Ambulatory Visit: Payer: 59 | Admitting: *Deleted

## 2012-11-01 DIAGNOSIS — J449 Chronic obstructive pulmonary disease, unspecified: Secondary | ICD-10-CM | POA: Insufficient documentation

## 2012-11-01 DIAGNOSIS — R002 Palpitations: Secondary | ICD-10-CM | POA: Insufficient documentation

## 2012-11-01 DIAGNOSIS — I739 Peripheral vascular disease, unspecified: Secondary | ICD-10-CM | POA: Insufficient documentation

## 2012-11-01 DIAGNOSIS — E669 Obesity, unspecified: Secondary | ICD-10-CM | POA: Insufficient documentation

## 2012-11-01 DIAGNOSIS — I10 Essential (primary) hypertension: Secondary | ICD-10-CM | POA: Insufficient documentation

## 2012-11-01 DIAGNOSIS — I2510 Atherosclerotic heart disease of native coronary artery without angina pectoris: Secondary | ICD-10-CM | POA: Insufficient documentation

## 2012-11-01 DIAGNOSIS — R55 Syncope and collapse: Secondary | ICD-10-CM | POA: Insufficient documentation

## 2012-11-01 DIAGNOSIS — R5381 Other malaise: Secondary | ICD-10-CM | POA: Insufficient documentation

## 2012-11-01 DIAGNOSIS — R42 Dizziness and giddiness: Secondary | ICD-10-CM | POA: Insufficient documentation

## 2012-11-01 DIAGNOSIS — R5383 Other fatigue: Secondary | ICD-10-CM | POA: Insufficient documentation

## 2012-11-01 DIAGNOSIS — Z9861 Coronary angioplasty status: Secondary | ICD-10-CM | POA: Insufficient documentation

## 2012-11-01 DIAGNOSIS — Z87891 Personal history of nicotine dependence: Secondary | ICD-10-CM | POA: Insufficient documentation

## 2012-11-01 DIAGNOSIS — R0989 Other specified symptoms and signs involving the circulatory and respiratory systems: Secondary | ICD-10-CM | POA: Insufficient documentation

## 2012-11-01 DIAGNOSIS — R0609 Other forms of dyspnea: Secondary | ICD-10-CM

## 2012-11-01 DIAGNOSIS — J4489 Other specified chronic obstructive pulmonary disease: Secondary | ICD-10-CM | POA: Insufficient documentation

## 2012-11-01 DIAGNOSIS — R0602 Shortness of breath: Secondary | ICD-10-CM

## 2012-11-01 MED ORDER — TECHNETIUM TC 99M SESTAMIBI GENERIC - CARDIOLITE
10.0000 | Freq: Once | INTRAVENOUS | Status: AC | PRN
  Administered 2012-11-01: 10 via INTRAVENOUS

## 2012-11-01 MED ORDER — REGADENOSON 0.4 MG/5ML IV SOLN
0.4000 mg | Freq: Once | INTRAVENOUS | Status: AC
  Administered 2012-11-01: 0.4 mg via INTRAVENOUS

## 2012-11-01 MED ORDER — TECHNETIUM TC 99M SESTAMIBI GENERIC - CARDIOLITE
30.0000 | Freq: Once | INTRAVENOUS | Status: AC | PRN
  Administered 2012-11-01: 30 via INTRAVENOUS

## 2012-11-01 NOTE — Procedures (Addendum)
[facility] [address] [place] [address] [phone]  Cardiology Nuclear Med Study  Jacob Romero is a 62 y.o. male     MRN : [id number]     DOB: 1951/01/23  Procedure Date: 11/01/2012  Nuclear Med Background Indication for Stress Test:  Evaluation for Ischemia History:  COPD and CAD;STENT/PTCA--04/2009 AND 06/2009 Cardiac Risk Factors: History of Smoking, Hypertension, Lipids, Obesity and PVD  Symptoms:  Dizziness, DOE, Fatigue, Light-Headedness, Palpitations, SOB and Syncope   Nuclear Pre-Procedure Caffeine/Decaff Intake:  7:00pm NPO After: 5:00am   IV Site: R Hand  IV 0.9% NS with Angio Cath:  22g  Chest Size (in):  48"  IV Started by: Emmit Pomfret, RN  Height: 5\' 9"  (1.753 m)  Cup Size: n/a  BMI:  Body mass index is 42.81 kg/(m^2). Weight:  290 lb (131.543 kg)   Tech Comments:  N/A    Nuclear Med Study 1 or 2 day study: 1 day  Stress Test Type:  Lexiscan  Order Authorizing Provider:  Nanetta Batty, MD    Resting Radionuclide: Technetium 84m Sestamibi  Resting Radionuclide Dose: 10.5 mCi   Stress Radionuclide:  Technetium 41m Sestamibi  Stress Radionuclide Dose: 29.8 mCi           Stress Protocol Rest HR: 68 Stress HR: 62  Rest BP: 151/102 Stress BP: 148/97  Exercise Time (min): n/a METS: n/a   Predicted Max HR: 158 bpm % Max HR: 54.43 bpm Rate Pressure Product: 95638  Dose of Adenosine (mg):  n/a Dose of Lexiscan: 0.4 mg  Dose of Atropine (mg): n/a Dose of Dobutamine: n/a mcg/kg/min (at max HR)  Stress Test Technologist: Esperanza Sheets, CCT Nuclear Technologist: Koren Shiver, CNMT   Rest Procedure:  Myocardial perfusion imaging was performed at rest 45 minutes following the intravenous administration of Technetium 56m Sestamibi. Stress Procedure:  The patient received IV Lexiscan 0.4 mg over 15-seconds.  Technetium 51m Sestamibi injected at 30-seconds.  There were no significant changes with  Lexiscan.  Quantitative spect images were obtained after a 45 minute delay.  Transient Ischemic Dilatation (Normal <1.22):  1.10 Lung/Heart Ratio (Normal <0.45):  0.25 QGS EDV:  139 ml QGS ESV:  78 ml LV Ejection Fraction: 44%  Rest ECG: NSR with 1st degree AVB  Stress ECG: Marked prolongation of the 1st degree AVB after lexiscan  QPS Raw Data Images:  Normal; no motion artifact; normal heart/lung ratio. Stress Images:  Normal homogeneous uptake in all areas of the myocardium. Rest Images:  Spotty areas of mild decreased uptake Subtraction (SDS):  No evidence of ischemia.  Impression Exercise Capacity:  Lexiscan with no exercise. BP Response:  Hypotensive blood pressure response. Clinical Symptoms:  There is dyspnea. ECG Impression:  Marked prolongation of the PR interval after adenosine. Comparison with Prior Nuclear Study: No recent study in our facility to compare to  Overall Impression:  Intermediate risk stress nuclear study without ischemia. Dilated ventricle. There was a resting 1st degree AVB and marked (almost doubling) of the PR interval with a few non-conducted beats after lexiscan..  LV Wall Motion:  Global hypokinesis. EF 44%.  Chrystie Nose, MD, [facility] Board Certified in Nuclear Cardiology Attending Cardiologist [facility]  Chrystie Nose, MD  11/01/2012 2:27 PM

## 2012-11-01 NOTE — Progress Notes (Signed)
Pt in for compression hose fitting.    Ankle: 9 1/4" (left); 9 1/2 (right) Calf: 17 1/4" (left); 17 3/4" (right)  Size 4 ordered.  Pt purchased here.

## 2012-11-02 ENCOUNTER — Telehealth: Payer: Self-pay | Admitting: Cardiovascular Disease

## 2012-11-02 ENCOUNTER — Ambulatory Visit (HOSPITAL_COMMUNITY)
Admission: RE | Admit: 2012-11-02 | Discharge: 2012-11-02 | Disposition: A | Discharge status: Home / Self Care | Payer: 59 | Source: Ambulatory Visit | Attending: Cardiovascular Disease | Admitting: Cardiovascular Disease

## 2012-11-02 DIAGNOSIS — I2510 Atherosclerotic heart disease of native coronary artery without angina pectoris: Secondary | ICD-10-CM | POA: Insufficient documentation

## 2012-11-02 DIAGNOSIS — I517 Cardiomegaly: Secondary | ICD-10-CM | POA: Insufficient documentation

## 2012-11-02 DIAGNOSIS — I059 Rheumatic mitral valve disease, unspecified: Secondary | ICD-10-CM | POA: Insufficient documentation

## 2012-11-02 DIAGNOSIS — R0602 Shortness of breath: Secondary | ICD-10-CM | POA: Insufficient documentation

## 2012-11-02 DIAGNOSIS — I359 Nonrheumatic aortic valve disorder, unspecified: Secondary | ICD-10-CM | POA: Insufficient documentation

## 2012-11-02 DIAGNOSIS — R0609 Other forms of dyspnea: Secondary | ICD-10-CM

## 2012-11-02 NOTE — Progress Notes (Signed)
[facility]   2D echo completed 11/02/2012.   Veda Canning, RDCS

## 2012-11-02 NOTE — Telephone Encounter (Signed)
Returning your call. °

## 2012-11-03 ENCOUNTER — Telehealth: Payer: Self-pay | Admitting: Cardiovascular Disease

## 2012-11-03 ENCOUNTER — Telehealth: Payer: Self-pay | Admitting: *Deleted

## 2012-11-03 DIAGNOSIS — I739 Peripheral vascular disease, unspecified: Secondary | ICD-10-CM

## 2012-11-03 NOTE — Telephone Encounter (Signed)
lmom 

## 2012-11-03 NOTE — Telephone Encounter (Signed)
Would you please send a another prescription for his generic Lasix. He is taking 2 a day instead of one. Please call to [facility] [phone].Pt wife said she had been talking to you and wanted to be sure that you got this message.

## 2012-11-03 NOTE — Telephone Encounter (Signed)
i spoke with Harriett Sine, results given of Mr Epifania Gore test results

## 2012-11-03 NOTE — Telephone Encounter (Signed)
Message copied by Marella Bile on Fri Nov 03, 2012 12:30 PM ------      Message from: Runell Gess      Created: Tue Oct 31, 2012  5:01 PM       Right above to below the knee pop BG patent. Repeat  q 12 months ------

## 2012-11-05 MED ORDER — FUROSEMIDE 40 MG PO TABS: 40.0000 mg | ORAL_TABLET | Freq: Every day | ORAL | Status: DC

## 2012-11-05 NOTE — Telephone Encounter (Signed)
Wilburt Finlay PAC increased the lasix to 40mg  due to some edema in a telephone encounter.  I sent the RX to [facility]

## 2012-11-09 ENCOUNTER — Telehealth: Payer: Self-pay | Admitting: Cardiovascular Disease

## 2012-11-09 NOTE — Telephone Encounter (Signed)
Returned call as Jacob Romero not available.  Jacob Romero, pt's wife, stated pt is going "downhill."  Stated pt is exhausted and barely moving.  Stated he's trying to go to work, but only goes there and comes home and rests.  Stated pt c/o chest tightness and stated he wasn't sure if it's his imagination.  Stated pt said he feels like he did when he had blockages before.  Wanted to know if pt can get a sooner appt w/ Jacob Romero.  Informed no openings before Friday appt.  Offered appt w/ a PA.  Stated she wanted pt to see Jacob Romero.  Asked that Jacob Romero be notified b/c she said she would try to get pt worked in sooner to see Jacob Romero.  Informed Jacob Romero will be notified.  Wife advised to take pt to ER for any worsening symptoms or call back for appt w/ PA.  Verbalized understanding and scheduled appt w/ L. Diona Fanti, PA-C on Monday, 8.18.14 since Jacob Romero will be in the office that day.  Agreed again to take pt to ER for worsening symptoms.  Message forwarded to K. Petra Kuba, Charity fundraiser.

## 2012-11-09 NOTE — Telephone Encounter (Signed)
lmom for Harriett Sine to keep the appt with Franky Macho on Monday

## 2012-11-09 NOTE — Telephone Encounter (Signed)
She wants to talk to Ena Dawley says Elijah Birk is going down hill-He is real tired and he is feeling some tightness in his chest-She thinks he might need to be seen sooner than the 22nd

## 2012-11-13 ENCOUNTER — Ambulatory Visit (INDEPENDENT_AMBULATORY_CARE_PROVIDER_SITE_OTHER): Payer: 59 | Admitting: Cardiology

## 2012-11-13 ENCOUNTER — Encounter: Payer: Self-pay | Admitting: Cardiology

## 2012-11-13 ENCOUNTER — Encounter: Payer: Self-pay | Admitting: Cardiovascular Disease

## 2012-11-13 VITALS — BP 134/96 | HR 64 | Ht 68.5 in | Wt 285.5 lb

## 2012-11-13 DIAGNOSIS — I2510 Atherosclerotic heart disease of native coronary artery without angina pectoris: Secondary | ICD-10-CM

## 2012-11-13 DIAGNOSIS — R5383 Other fatigue: Secondary | ICD-10-CM

## 2012-11-13 DIAGNOSIS — I2589 Other forms of chronic ischemic heart disease: Secondary | ICD-10-CM

## 2012-11-13 DIAGNOSIS — I255 Ischemic cardiomyopathy: Secondary | ICD-10-CM | POA: Insufficient documentation

## 2012-11-13 DIAGNOSIS — Z01818 Encounter for other preprocedural examination: Secondary | ICD-10-CM

## 2012-11-13 DIAGNOSIS — R931 Abnormal findings on diagnostic imaging of heart and coronary circulation: Secondary | ICD-10-CM | POA: Insufficient documentation

## 2012-11-13 DIAGNOSIS — I739 Peripheral vascular disease, unspecified: Secondary | ICD-10-CM

## 2012-11-13 DIAGNOSIS — R5381 Other malaise: Secondary | ICD-10-CM

## 2012-11-13 DIAGNOSIS — I441 Atrioventricular block, second degree: Secondary | ICD-10-CM | POA: Insufficient documentation

## 2012-11-13 DIAGNOSIS — G4730 Sleep apnea, unspecified: Secondary | ICD-10-CM

## 2012-11-13 DIAGNOSIS — D689 Coagulation defect, unspecified: Secondary | ICD-10-CM

## 2012-11-13 DIAGNOSIS — R9439 Abnormal result of other cardiovascular function study: Secondary | ICD-10-CM

## 2012-11-13 NOTE — Assessment & Plan Note (Signed)
On C-pap 

## 2012-11-13 NOTE — Assessment & Plan Note (Signed)
New drop in his EF

## 2012-11-13 NOTE — Progress Notes (Signed)
11/13/2012 SAHITH NURSE   1951/03/03  [id number]  Primary Physicia Robb Matar, MD Primary Cardiologist: Dr Allyson Sabal  HPI:  The patient is a very pleasant 62 year old severely overweight married Caucasian male father of 2, grandfather of 2 grandchildren. He has a history remarkable for hypertension, hyperlipidemia and known CAD. He does have sleep apnea on CPAP. He had above-to-below the knee popliteal bypass grafting by Dr. Liliane Bade after an occluded distal right SFA stent in 2007 which he is now asymptomatic from. We follow this with annual Dopplers. Dr Allyson Sabal cathed him in May 15, 2009 via the right radial approach revealing a high-grade mid RCA stenosis with a totally occluded PLA. Dr Allyson Sabal attempted recanalization, however, he developed a subarachnoid hemorrhage and the procedure was aborted. Dr Allyson Sabal brought him back for attempted RCA PCI in March 2011 but was only able to perform an angioplasty du to some technical issues. Dr. Tresa Endo brought him back in April 2011and performed high-speed rotational atherectomy, PCI and stenting of his mid RCA but was unable to recanalize the PLA branch. Recently he has been having exertional fatigue and some chest discomfort. A Myoview showed dilated LV but no ischemia. Echo showed a drop in his EF to 45% with inferior wall HK not mentioned on his prior echo. He was scheduled to see Dr Allyson Sabal in a few weeks but called and asked to be seen sooner secondary to increasing symptoms.    Current Outpatient Prescriptions  Medication Sig Dispense Refill  . acetaminophen (TYLENOL) 650 MG CR tablet Take 650 mg by mouth every 8 (eight) hours as needed for pain.      . Acetaminophen-Caffeine (EXCEDRIN TENSION HEADACHE PO) Take 2 tablets by mouth as needed.        [name] ADVAIR DISKUS 250-50 MCG/DOSE AEPB Inhale 1 puff into the lungs 2 (two) times daily.      [name] albuterol (PROVENTIL HFA;VENTOLIN HFA) 108 (90 BASE) MCG/ACT inhaler Inhale 2 puffs into the lungs every 6 (six)  hours as needed for wheezing.      . ANDROGEL PUMP 20.25 MG/ACT (1.62%) GEL 3 application daily.      [name] aspirin EC 81 MG tablet Take 81 mg by mouth daily.        . Aspirin-Acetaminophen-Caffeine (EXCEDRIN MIGRAINE PO) Take by mouth as needed.      . Azelastine-Fluticasone (DYMISTA) 137-50 MCG/ACT SUSP Place into the nose as needed.      . cetirizine (ZYRTEC) 10 MG tablet Take 10 mg by mouth as needed for allergies.      [name] clopidogrel (PLAVIX) 75 MG tablet Take 75 mg by mouth daily.        . DULoxetine (CYMBALTA) 30 MG capsule Take 1 capsule by mouth daily.      [name] ezetimibe (ZETIA) 10 MG tablet Take 10 mg by mouth daily.        . Fiber CAPS Take by mouth.        . fish oil-omega-3 fatty acids 1000 MG capsule Take 3 g by mouth daily.       . furosemide (LASIX) 40 MG tablet Take 1 tablet (40 mg total) by mouth daily.  30 tablet  6  . ketorolac (ACULAR) 0.5 % ophthalmic solution Place 1 drop into both eyes 4 (four) times daily as needed.      [name] lisinopril-hydrochlorothiazide (PRINZIDE,ZESTORETIC) 20-12.5 MG per tablet Take 2 tablets by mouth.       . meloxicam (MOBIC) 7.5 MG tablet Take 7.5 mg by mouth  daily.      . metoprolol succinate (TOPROL-XL) 50 MG 24 hr tablet Take 50 mg by mouth daily. Take with or immediately following a meal.      . montelukast (SINGULAIR) 10 MG tablet Take 1 tablet by mouth daily.      . Multiple Vitamin (MULTIVITAMIN PO) Take by mouth.        . nitroGLYCERIN (NITROSTAT) 0.4 MG SL tablet Place 0.4 mg under the tongue every 5 (five) minutes as needed for chest pain.      . pantoprazole (PROTONIX) 40 MG tablet Take 1 tablet by mouth daily.      . rosuvastatin (CRESTOR) 5 MG tablet Take 5 mg by mouth daily.       No current facility-administered medications for this visit.    Allergies  Allergen Reactions  . Statins     Unable to tolerate high dose statins    History   Social History  . Marital Status: Married    Spouse Name: N/A    Number of Children: N/A  .  Years of Education: N/A   Occupational History  . Not on file.   Social History Main Topics  . Smoking status: Former Smoker    Types: Cigarettes    Quit date: 06/01/1991  . Smokeless tobacco: Not on file  . Alcohol Use: No  . Drug Use:   . Sexual Activity:    Other Topics Concern  . Not on file   Social History Narrative  . No narrative on file    Family History- Adopted    Review of Systems: General: negative for chills, fever, night sweats or weight changes.  Cardiovascular:  orthopnea, palpitations, paroxysmal nocturnal dyspne. He has noted increasing LE edema. He is now on Lasix and compression stocking with some improvement. Dermatological: negative for rash Respiratory: negative for cough or wheezing Urologic: negative for hematuria Abdominal: negative for nausea, vomiting, diarrhea, bright red blood per rectum, melena, or hematemesis Neurologic: negative for visual changes, syncope, or dizziness All other systems reviewed and are otherwise negative except as noted above.    Blood pressure 134/96, pulse 64, height 5' 8.5" (1.74 m), weight 285 lb 8 oz (129.502 kg).  General appearance: alert, cooperative, no distress and morbidly obese Neck: no carotid bruit and no JVD Lungs: clear to auscultation bilaterally Heart: regular rate and rhythm Abdomen: obese Extremities: 1+ edema, stockings in place Pulses: 2+ and symmetric Skin: Skin color, texture, turgor normal. No rashes or lesions Neurologic: Grossly normal  EKG  Type 1 second degree AVB.  ASSESSMENT AND PLAN:   Fatigue This sounds like it may be angina. He has exertional weakness and some chest discomfort.  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  Sleep apnea On C-pap  PVD (peripheral vascular  disease) Hx of Rt FPBPG   PLAN  Pt seen by Dr Allyson Sabal and myself in the office today. Plan is for OP cath.     Corine Shelter KPA-C 11/13/2012 5:19 PM  I have examined and reviewed the information on Mr. Tyler. Based on his decreased ejection fraction and symptoms I feel it prudent to proceed with cardiac catheterization via right femoral approach. I have cath him radially in the past experienced poor guide backup in the setting of an intervention. The patient is agreeable with this  approach.  Runell Gess, M.D., [facility] [address] [place], [place]  [address]  [phone] 11/13/2012 5:58 PM

## 2012-11-13 NOTE — Patient Instructions (Addendum)

## 2012-11-13 NOTE — Assessment & Plan Note (Signed)
Hx of Rt FPBPG

## 2012-11-13 NOTE — Assessment & Plan Note (Signed)
Intermediate study with dilated LV

## 2012-11-13 NOTE — Assessment & Plan Note (Signed)
This sounds like it may be angina. He has exertional weakness and some chest discomfort.

## 2012-11-13 NOTE — Assessment & Plan Note (Signed)
New finding

## 2012-11-14 ENCOUNTER — Telehealth: Payer: Self-pay | Admitting: Cardiovascular Disease

## 2012-11-14 ENCOUNTER — Other Ambulatory Visit: Payer: Self-pay | Admitting: *Deleted

## 2012-11-14 ENCOUNTER — Ambulatory Visit
Admission: RE | Admit: 2012-11-14 | Discharge: 2012-11-14 | Disposition: A | Discharge status: Home / Self Care | Payer: 59 | Source: Ambulatory Visit | Attending: Cardiology | Admitting: Cardiology

## 2012-11-14 DIAGNOSIS — Z01818 Encounter for other preprocedural examination: Secondary | ICD-10-CM

## 2012-11-14 LAB — CBC
HCT: 49.1 % (ref 39.0–52.0)
Hemoglobin: 16.1 g/dL (ref 13.0–17.0)
MCH: 25.4 pg — ABNORMAL LOW (ref 26.0–34.0)
MCHC: 32.8 g/dL (ref 30.0–36.0)
MCV: 77.4 fL — ABNORMAL LOW (ref 78.0–100.0)
Platelets: 242 10*3/uL (ref 150–400)
RBC: 6.34 MIL/uL — ABNORMAL HIGH (ref 4.22–5.81)
RDW: 17.2 % — ABNORMAL HIGH (ref 11.5–15.5)
WBC: 7.5 10*3/uL (ref 4.0–10.5)

## 2012-11-14 MED ORDER — NITROGLYCERIN 0.4 MG SL SUBL: 0.4000 mg | SUBLINGUAL_TABLET | SUBLINGUAL | Status: DC | PRN

## 2012-11-14 NOTE — Telephone Encounter (Signed)
Refill(s) sent to pharmacy.   Returned call and spoke w/ Harriett Sine, pt's wife.  Informed refill sent.  Verbalized understanding.

## 2012-11-14 NOTE — Telephone Encounter (Signed)
Pt's wife called stating [facility].

## 2012-11-15 ENCOUNTER — Encounter (HOSPITAL_COMMUNITY): Payer: Self-pay | Admitting: Pharmacy Technician

## 2012-11-15 LAB — BASIC METABOLIC PANEL WITH GFR
BUN: 18 mg/dL (ref 6–23)
CO2: 33 meq/L — ABNORMAL HIGH (ref 19–32)
Calcium: 9.7 mg/dL (ref 8.4–10.5)
Chloride: 97 meq/L (ref 96–112)
Creat: 1.17 mg/dL (ref 0.50–1.35)
Glucose, Bld: 128 mg/dL — ABNORMAL HIGH (ref 70–99)
Potassium: 3.3 meq/L — ABNORMAL LOW (ref 3.5–5.3)
Sodium: 140 meq/L (ref 135–145)

## 2012-11-15 LAB — APTT: aPTT: 35 seconds (ref 24–37)

## 2012-11-15 LAB — PROTIME-INR
INR: 1.09 (ref <1.50)
Prothrombin Time: 14.1 seconds (ref 11.6–15.2)

## 2012-11-17 ENCOUNTER — Ambulatory Visit: Payer: 59 | Admitting: Cardiovascular Disease

## 2012-11-21 ENCOUNTER — Encounter (HOSPITAL_COMMUNITY)
Admission: RE | Disposition: A | Discharge status: Home / Self Care | Payer: Self-pay | Source: Ambulatory Visit | Attending: Cardiovascular Disease

## 2012-11-21 ENCOUNTER — Ambulatory Visit (HOSPITAL_COMMUNITY)
Admission: RE | Admit: 2012-11-21 | Discharge: 2012-11-21 | Disposition: A | Discharge status: Home / Self Care | Payer: 59 | Source: Ambulatory Visit | Attending: Cardiovascular Disease | Admitting: Cardiovascular Disease

## 2012-11-21 DIAGNOSIS — R9431 Abnormal electrocardiogram [ECG] [EKG]: Secondary | ICD-10-CM

## 2012-11-21 DIAGNOSIS — E785 Hyperlipidemia, unspecified: Secondary | ICD-10-CM | POA: Insufficient documentation

## 2012-11-21 DIAGNOSIS — E663 Overweight: Secondary | ICD-10-CM | POA: Insufficient documentation

## 2012-11-21 DIAGNOSIS — I10 Essential (primary) hypertension: Secondary | ICD-10-CM | POA: Insufficient documentation

## 2012-11-21 DIAGNOSIS — I2510 Atherosclerotic heart disease of native coronary artery without angina pectoris: Secondary | ICD-10-CM | POA: Insufficient documentation

## 2012-11-21 DIAGNOSIS — G4733 Obstructive sleep apnea (adult) (pediatric): Secondary | ICD-10-CM | POA: Insufficient documentation

## 2012-11-21 DIAGNOSIS — Z01818 Encounter for other preprocedural examination: Secondary | ICD-10-CM

## 2012-11-21 HISTORY — PX: LEFT HEART CATHETERIZATION WITH CORONARY ANGIOGRAM: SHX5451

## 2012-11-21 LAB — BASIC METABOLIC PANEL
BUN: 17 mg/dL (ref 6–23)
Calcium: 9.3 mg/dL (ref 8.4–10.5)
Chloride: 99 mEq/L (ref 96–112)
Creatinine, Ser: 1.03 mg/dL (ref 0.50–1.35)
GFR calc Af Amer: 88 mL/min — ABNORMAL LOW (ref >90)
GFR calc non Af Amer: 76 mL/min — ABNORMAL LOW (ref >90)

## 2012-11-21 SURGERY — LEFT HEART CATHETERIZATION WITH CORONARY ANGIOGRAM
Anesthesia: LOCAL

## 2012-11-21 MED ORDER — HYDRALAZINE HCL 20 MG/ML IJ SOLN
INTRAMUSCULAR | Status: AC
  Filled 2012-11-21: qty 1

## 2012-11-21 MED ORDER — ACETAMINOPHEN 325 MG PO TABS: 650.0000 mg | ORAL_TABLET | ORAL | Status: DC | PRN

## 2012-11-21 MED ORDER — ONDANSETRON HCL 4 MG/2ML IJ SOLN: 4.0000 mg | Freq: Four times a day (QID) | INTRAMUSCULAR | Status: DC | PRN

## 2012-11-21 MED ORDER — ASPIRIN 81 MG PO CHEW
324.0000 mg | CHEWABLE_TABLET | ORAL | Status: AC
  Administered 2012-11-21: 324 mg via ORAL

## 2012-11-21 MED ORDER — FENTANYL CITRATE 0.05 MG/ML IJ SOLN
INTRAMUSCULAR | Status: AC
  Filled 2012-11-21: qty 2

## 2012-11-21 MED ORDER — LIDOCAINE HCL (PF) 1 % IJ SOLN
INTRAMUSCULAR | Status: AC
  Filled 2012-11-21: qty 30

## 2012-11-21 MED ORDER — DIAZEPAM 5 MG PO TABS
5.0000 mg | ORAL_TABLET | ORAL | Status: AC
  Administered 2012-11-21: 5 mg via ORAL

## 2012-11-21 MED ORDER — NITROGLYCERIN 0.2 MG/ML ON CALL CATH LAB
INTRAVENOUS | Status: AC
  Filled 2012-11-21: qty 1

## 2012-11-21 MED ORDER — SODIUM CHLORIDE 0.9 % IJ SOLN: 3.0000 mL | INTRAMUSCULAR | Status: DC | PRN

## 2012-11-21 MED ORDER — MIDAZOLAM HCL 2 MG/2ML IJ SOLN
INTRAMUSCULAR | Status: AC
  Filled 2012-11-21: qty 2

## 2012-11-21 MED ORDER — SODIUM CHLORIDE 0.9 % IV SOLN: INTRAVENOUS | Status: AC

## 2012-11-21 MED ORDER — HYDRALAZINE HCL 20 MG/ML IJ SOLN: 10.0000 mg | INTRAMUSCULAR | Status: DC

## 2012-11-21 MED ORDER — POTASSIUM CHLORIDE CRYS ER 20 MEQ PO TBCR
40.0000 meq | EXTENDED_RELEASE_TABLET | ORAL | Status: AC
  Administered 2012-11-21: 40 meq via ORAL

## 2012-11-21 MED ORDER — SODIUM CHLORIDE 0.9 % IV SOLN
INTRAVENOUS | Status: DC
  Administered 2012-11-21: 15:00:00 via INTRAVENOUS

## 2012-11-21 MED ORDER — HEPARIN (PORCINE) IN NACL 2-0.9 UNIT/ML-% IJ SOLN
INTRAMUSCULAR | Status: AC
  Filled 2012-11-21: qty 1000

## 2012-11-21 NOTE — CV Procedure (Signed)
Jacob Romero is a 62 y.o. male    [id number] LOCATION:  FACILITY: [facility]  PHYSICIAN: Jacob Romero, M.D. [birth year]   DATE OF PROCEDURE:  11/21/2012  DATE OF DISCHARGE:   CARDIAC CATHETERIZATION     History obtained from chart review.The patient is a very pleasant 62 year old severely overweight married Caucasian male father of 2, grandfather of 2 grandchildren. He has a history remarkable for hypertension, hyperlipidemia and known CAD. He does have sleep apnea on CPAP. He had above-to-below the knee popliteal bypass grafting by Jacob. Liliane Romero after an occluded distal right SFA stent in 2007 which he is now asymptomatic from. We follow this with annual Dopplers. Jacob Romero cathed him in May 15, 2009 via the right radial approach revealing a high-grade mid RCA stenosis with a totally occluded PLA. Jacob Romero attempted recanalization, however, he developed a subarachnoid hemorrhage and the procedure was aborted. Jacob Romero brought him back for attempted RCA PCI in March 2011 but was only able to perform an angioplasty du to some technical issues. Jacob Romero brought him back in April 2011and performed high-speed rotational atherectomy, PCI and stenting of his mid RCA but was unable to recanalize the PLA branch. Recently he has been having exertional fatigue and some chest discomfort. A Myoview showed dilated LV but no ischemia. Echo showed a drop in his EF to 45% with inferior wall HK not mentioned on his prior echo. He was scheduled to see Jacob Romero in a few weeks but called and asked to be seen sooner secondary to increasing symptoms.     PROCEDURE DESCRIPTION:    The patient was brought to the second floor  [facility] [facility] lab in the postabsorptive state. He was  premedicated with Valium 5 mg by mouth, IV Versed and fentanyl.[name] His right groin was prepped and shaved in usual sterile fashion. Xylocaine 1% was used  for local anesthesia. A 5 French sheath was inserted into the right  common femoral  artery using standard Seldinger technique.5 French right and left Judkins diagnostic catheters along with a 5 French pigtail catheter were used for selective coronary angiography, left ventriculography. Visipaque was used for the entirety of the case. Retroperiaortic, left ventricular and pullback pressures were recorded.   HEMODYNAMICS:    AO SYSTOLIC/AO DIASTOLIC: 146/77   LV SYSTOLIC/LV DIASTOLIC: 153/11  ANGIOGRAPHIC RESULTS:   1. Left main; normal  2. LAD; normal 3. Left circumflex; codominant and normal.  4. Right coronary artery; codominant with a widely patent stent in the midportion and an occluded posterior lateral branch with recanalization un changed change from his prior angiogram 5. Left ventriculography; RAO left ventriculogram was performed using  25 mL of Visipaque dye at 12 mL/second. The overall LVEF estimated  60 %  Without wall motion abnormalities  IMPRESSION:Jacob Romero has a widely patent stent in his RCA with occluded posterior lateral branch that is recanalized. He has normal left ventricular function. His anatomy is unchanged from his prior cath. These findings cannot explain his current symptoms. A femoral angiogram was performed and a MYNX closure device was then deployed successfully obtaining hemostasis. The patient left the lab in stable condition. He'll be discharged him in 2 hours and will see me back in the office in a week for followup.  Jacob Gess MD, [facility] 11/21/2012 5:11 PM

## 2012-11-21 NOTE — H&P (Signed)
    Pt was reexamined and existing H & P reviewed. No changes found.  Runell Gess, MD [facility] 11/21/2012 4:35 PM

## 2012-11-22 MED FILL — Potassium Chloride Microencapsulated Crys ER Tab 20 mEq: ORAL | Qty: 1 | Status: AC

## 2012-11-28 ENCOUNTER — Encounter: Payer: Self-pay | Admitting: Physician Assistant

## 2012-11-28 ENCOUNTER — Ambulatory Visit (INDEPENDENT_AMBULATORY_CARE_PROVIDER_SITE_OTHER): Payer: 59 | Admitting: Physician Assistant

## 2012-11-28 VITALS — BP 132/94 | HR 61 | Ht 68.5 in | Wt 282.9 lb

## 2012-11-28 DIAGNOSIS — I2589 Other forms of chronic ischemic heart disease: Secondary | ICD-10-CM

## 2012-11-28 DIAGNOSIS — I10 Essential (primary) hypertension: Secondary | ICD-10-CM

## 2012-11-28 DIAGNOSIS — R0609 Other forms of dyspnea: Secondary | ICD-10-CM

## 2012-11-28 DIAGNOSIS — I441 Atrioventricular block, second degree: Secondary | ICD-10-CM

## 2012-11-28 DIAGNOSIS — I255 Ischemic cardiomyopathy: Secondary | ICD-10-CM

## 2012-11-28 DIAGNOSIS — R0989 Other specified symptoms and signs involving the circulatory and respiratory systems: Secondary | ICD-10-CM

## 2012-11-28 MED ORDER — METOPROLOL SUCCINATE ER 25 MG PO TB24: 50.0000 mg | ORAL_TABLET | Freq: Every day | ORAL | Status: DC

## 2012-11-28 MED ORDER — METOPROLOL SUCCINATE ER 25 MG PO TB24: 25.0000 mg | ORAL_TABLET | Freq: Every day | ORAL | Status: DC

## 2012-11-28 NOTE — Patient Instructions (Addendum)
Reduce Toprol XL to 25mg  daily.    Call if you have worsening symptoms.  Follow up in 30 days with Dr. Allyson Sabal

## 2012-11-28 NOTE — Assessment & Plan Note (Signed)
Does not appear to be in overt heart failure at this point. His lower extremity edema has improved with Lasix and also improves by morning time.  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

## 2012-11-28 NOTE — Progress Notes (Signed)
Date:  11/28/2012   ID:  Tedd Sias, DOB 1950/09/26, MRN [id number]  PCP:  Robb Matar, MD  Primary Cardiologist:  Allyson Sabal    History of Present Illness: ROCK SOBOL is a 62 y.o. severely overweight married Caucasian male father of 2, grandfather of 2 grandchildren. He has a history remarkable for hypertension, hyperlipidemia and known CAD. He does have sleep apnea on CPAP. He had above-to-below the knee popliteal bypass grafting by Dr. Liliane Bade after an occluded distal right SFA stent in 2007 which he is now asymptomatic from. We follow this with annual Dopplers. Dr Allyson Sabal cathed him in May 15, 2009 via the right radial approach revealing a high-grade mid RCA stenosis with a totally occluded PLA. Dr Allyson Sabal attempted recanalization, however, he developed a subarachnoid hemorrhage and the procedure was aborted. Dr Allyson Sabal brought him back for attempted RCA PCI in March 2011 but was only able to perform an angioplasty du to some technical issues. Dr. Tresa Endo brought him back in April 2011and performed high-speed rotational atherectomy, PCI and stenting of his mid RCA but was unable to recanalize the PLA branch. Recently he has been having exertional fatigue and some chest discomfort. A Myoview showed dilated LV but no ischemia. Echo showed a drop in his EF to 45%-50% with inferior wall HK not mentioned on his prior echo.   He under went coronary angiogram on with Dr. Allyson Sabal on 11/21/12 which revealed a widely patent mid RCA stent and an occluded posterior lateral branch with recanalization unchanged from prior.  LV gram estimated EF at 60%.  He present today for follow up.  Patient appears to be doing fine after his cardiac catheterization. He does continue to complain of severe fatigue and dyspnea. He states he can walk 25 yards without having to stop twice. This is been ongoing since approximately June. Says he has no energy and some dizziness. He also reports a 2/10 chest tightness; about 12  episodes in the last 3 months. He says the humidity and heat it difficult to breathe.  The patient currently denies nausea, vomiting, fever, orthopnea, PND, cough, congestion, abdominal pain, hematochezia, melena, lower extremity edema, claudication.  Wt Readings from Last 3 Encounters:  11/28/12 282 lb 14.4 oz (128.323 kg)  11/21/12 270 lb (122.471 kg)  11/21/12 270 lb (122.471 kg)     Past Medical History  Diagnosis Date  . Hyperlipidemia   . Hypertension   . Depression   . GERD (gastroesophageal reflux disease)   . Hiatal hernia   . CAD (coronary artery disease) 2011    RCA HSRA, known occlued PDA  . Sleep apnea     on C-pap  . PVD (peripheral vascular disease)     Rt FPBPG  . Dyspnea on exertion   . Bilateral lower extremity edema     Current Outpatient Prescriptions  Medication Sig Dispense Refill  . acetaminophen (TYLENOL) 650 MG CR tablet Take 650 mg by mouth every 8 (eight) hours as needed for pain.      . Acetaminophen-Caffeine (EXCEDRIN TENSION HEADACHE PO) Take 2 tablets by mouth daily as needed (tension headache).       . ADVAIR DISKUS 250-50 MCG/DOSE AEPB Inhale 1 puff into the lungs 2 (two) times daily.      [name] albuterol (PROVENTIL HFA;VENTOLIN HFA) 108 (90 BASE) MCG/ACT inhaler Inhale 2 puffs into the lungs every 6 (six) hours as needed for wheezing or shortness of breath.       [name] aspirin  EC 81 MG tablet Take 81 mg by mouth daily.        . Aspirin-Acetaminophen-Caffeine (EXCEDRIN MIGRAINE PO) Take 1 tablet by mouth daily as needed (migraine).       . Azelastine-Fluticasone (DYMISTA) 137-50 MCG/ACT SUSP Place 1 spray into the nose daily as needed (seasonal allergies).       . cetirizine (ZYRTEC) 10 MG tablet Take 10 mg by mouth daily as needed for allergies.       [name] clopidogrel (PLAVIX) 75 MG tablet Take 75 mg by mouth daily.        . DULoxetine (CYMBALTA) 30 MG capsule Take 30 mg by mouth daily.       [name] ezetimibe (ZETIA) 10 MG tablet Take 10 mg by mouth daily.         . Fiber CAPS Take 1 capsule by mouth daily.       . fish oil-omega-3 fatty acids 1000 MG capsule Take 3 g by mouth daily.       . furosemide (LASIX) 40 MG tablet Take 1 tablet (40 mg total) by mouth daily.  30 tablet  6  . ketorolac (ACULAR) 0.5 % ophthalmic solution Place 1 drop into both eyes 4 (four) times daily.       [name] lisinopril-hydrochlorothiazide (PRINZIDE,ZESTORETIC) 20-12.5 MG per tablet Take 2 tablets by mouth.       . meloxicam (MOBIC) 7.5 MG tablet Take 7.5 mg by mouth daily.      . metoprolol succinate (TOPROL-XL) 25 MG 24 hr tablet Take 1 tablet (25 mg total) by mouth daily.  30 tablet  5  . montelukast (SINGULAIR) 10 MG tablet Take 10 mg by mouth daily.       . Multiple Vitamin (MULTIVITAMIN WITH MINERALS) TABS tablet Take 1 tablet by mouth daily.      . nitroGLYCERIN (NITROSTAT) 0.4 MG SL tablet Place 0.4 mg under the tongue every 5 (five) minutes as needed for chest pain.      . pantoprazole (PROTONIX) 40 MG tablet Take 40 mg by mouth daily.       . rosuvastatin (CRESTOR) 5 MG tablet Take 5 mg by mouth daily.      . ANDROGEL PUMP 20.25 MG/ACT (1.62%) GEL Apply 3 application topically daily. Applies to shoulders       No current facility-administered medications for this visit.    Allergies:    Allergies  Allergen Reactions  . Statins     Unable to tolerate high dose statins    Social History:  The patient  reports that he quit smoking about 21 years ago. His smoking use included Cigarettes. He smoked 0.00 packs per day. He does not have any smokeless tobacco history on file. He reports that he does not drink alcohol.   Family history:   Family History  Problem Relation Age of Onset  . Adopted: Yes    ROS:  Please see the history of present illness.  All other systems reviewed and negative.   PHYSICAL EXAM: VS:  BP 132/94  Pulse 61  Ht 5' 8.5" (1.74 m)  Wt 282 lb 14.4 oz (128.323 kg)  BMI 42.38 kg/m2 Obese, well developed, in no acute distress HEENT:  Pupils are equal round react to light accommodation extraocular movements are intact.  Neck: no JVDNo cervical lymphadenopathy. Cardiac: Regular rate and rhythm without murmurs rubs or gallops. Lungs:  clear to auscultation bilaterally, no wheezing, rhonchi or rales Ext: no lower extremity edema.  2+ radial and dorsalis pedis  pulses. Skin: warm and dry.  Right groin has moderate area of ecchymosis. No hematoma and is nontender Neuro:  Grossly normal  EKG:  Secondary AV block type I. Rate 61 beats per minute  ASSESSMENT AND PLAN:  Problem List Items Addressed This Visit   Second degree atrioventricular block, Mobitz (type) I - Primary     This apparently is a new finding for the patient and identified during his last office visit. I am going to decrease his Toprol-XL to 25 mg daily and then start him on a CardioNet monitor. We'll continue to review the telemetry and titrate the Toprol until it is discontinued if needed.      Relevant Medications      metoprolol succinate (TOPROL-XL) 24 hr tablet   Obesity, Class III, BMI 40-49.9 (morbid obesity)     We discussed dietary modifications. He states that he is seeing a dietitian in an effort to lose weight. Also provided him with additional dietary resources.    Hypertension     Blood pressure is mildly above her target of 130/80.    Relevant Medications      metoprolol succinate (TOPROL-XL) 24 hr tablet   Cardiomyopathy, ischemic- 45-50% EF by echo 8/14     Does not appear to be in overt heart failure at this point. His lower extremity edema has improved with Lasix and also improves by morning time.  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    Relevant Medications      metoprolol succinate (TOPROL-XL) 24 hr tablet

## 2012-11-28 NOTE — Assessment & Plan Note (Signed)
Blood pressure is mildly above her target of 130/80.

## 2012-11-28 NOTE — Assessment & Plan Note (Signed)
We discussed dietary modifications. He states that he is seeing a dietitian in an effort to lose weight. Also provided him with additional dietary resources.

## 2012-11-28 NOTE — Assessment & Plan Note (Signed)
This apparently is a new finding for the patient and identified during his last office visit. I am going to decrease his Toprol-XL to 25 mg daily and then start him on a CardioNet monitor. We'll continue to review the telemetry and titrate the Toprol until it is discontinued if needed.

## 2012-11-29 ENCOUNTER — Telehealth: Payer: Self-pay | Admitting: Physician Assistant

## 2012-11-29 NOTE — Telephone Encounter (Signed)
Returned call to Snyder, pt's wife.  Stated pt put handheld in base charger last night and around 2am it started an alarm.  Stated it wasn't charging and she plugged the cord into the handheld for the remainder of the night.  Stated pt put a new battery in the monitor this morning.  Wanted to make sure everything was ok.  Informed she did advise pt correctly.  Advised they no longer use base charger and plug cord in like she did last night and make sure pt changes battery in monitor every morning.  Also advised she call [employer] for additional supplies before pt runs out.  Verbalized understanding and agreed w/ plan.

## 2012-11-29 NOTE — Telephone Encounter (Signed)
He had a monitor put on yesterday-have some questions she needs to ask.

## 2012-12-29 ENCOUNTER — Encounter: Payer: Self-pay | Admitting: Cardiovascular Disease

## 2012-12-29 ENCOUNTER — Ambulatory Visit (INDEPENDENT_AMBULATORY_CARE_PROVIDER_SITE_OTHER): Payer: 59 | Admitting: Cardiovascular Disease

## 2012-12-29 VITALS — BP 138/94 | HR 78 | Ht 68.5 in | Wt 281.0 lb

## 2012-12-29 DIAGNOSIS — I2510 Atherosclerotic heart disease of native coronary artery without angina pectoris: Secondary | ICD-10-CM

## 2012-12-29 DIAGNOSIS — E785 Hyperlipidemia, unspecified: Secondary | ICD-10-CM

## 2012-12-29 DIAGNOSIS — I441 Atrioventricular block, second degree: Secondary | ICD-10-CM | POA: Insufficient documentation

## 2012-12-29 DIAGNOSIS — I10 Essential (primary) hypertension: Secondary | ICD-10-CM

## 2012-12-29 NOTE — Assessment & Plan Note (Signed)
  Borderline controlled on antihypertensive medications

## 2012-12-29 NOTE — Progress Notes (Signed)
12/29/2012 Jacob Romero   12/12/1950  [id number]  Primary Physician Robb Matar, MD Primary Cardiologist: Runell Gess MD [name]    HPI: The patient is a very pleasant 62 year old severely overweight married Caucasian male father of 2, grandfather of 2 grandchildren who I saw 6 months ago and who is accompanied by his wife today. He has a history remarkable for hypertension, hyperlipidemia and known CAD. He does have sleep apnea on CPAP. He had above-to-below the knee popliteal bypass grafting by Dr. Liliane Bade after an occluded distal right SFA stent which he is now asymptomatic from and which we get annual Dopplers. I cathed him May 15, 2009 via the right radial approach revealing a high-grade mid RCA stenosis with a totally occluded PLA. There was an anterior takeoff. I attempted recanalization, however, he developed a subarachnoid hemorrhage and the procedure was aborted. Dr. Tresa Endo brought him back and performed high-speed rotational atherectomy, PCI and stenting of his mid RCA but was unable to recanalize the PLA branch. He is completely asymptomatic with regards to this. He has got an excellent lipid profile for secondary prevention, followed closely by Dr. Samuel Germany. His total cholesterol is 140, LDL of 74 and HDL of 45.  I saw him back in the office 05/05/12 at which time he was currently stable. For the last several months he developed progressive dyspnea on exertion fatigue and lower extreme edema. The dyspnea was similar to his symptoms prior to intervention in the past.I obtained a 2-D echo which showed mild to moderate LV dysfunction with an EF in the 45% range and inferior hypokinesia. A Myoview stress test likewise showed LV dysfunction. Because of this I performed cardiac catheterization on him 11/21/12 revealing a patent mid RCA stent with an occluded posterolateral branch which is chronic. His EF was 60%. He continues to be symptomatic. The monitor showed  sinus rhythm, sinus tach and second degree AV block.  Current Outpatient Prescriptions  Medication Sig Dispense Refill  . acetaminophen (TYLENOL) 650 MG CR tablet Take 650 mg by mouth every 8 (eight) hours as needed for pain.      . Acetaminophen-Caffeine (EXCEDRIN TENSION HEADACHE PO) Take 2 tablets by mouth daily as needed (tension headache).       . ADVAIR DISKUS 250-50 MCG/DOSE AEPB Inhale 1 puff into the lungs 2 (two) times daily.      [name] albuterol (PROVENTIL HFA;VENTOLIN HFA) 108 (90 BASE) MCG/ACT inhaler Inhale 2 puffs into the lungs every 6 (six) hours as needed for wheezing or shortness of breath.       . ANDROGEL PUMP 20.25 MG/ACT (1.62%) GEL Apply 3 application topically daily. Applies to shoulders      . aspirin EC 81 MG tablet Take 81 mg by mouth daily.        . Aspirin-Acetaminophen-Caffeine (EXCEDRIN MIGRAINE PO) Take 1 tablet by mouth daily as needed (migraine).       . Azelastine-Fluticasone (DYMISTA) 137-50 MCG/ACT SUSP Place 1 spray into the nose daily as needed (seasonal allergies).       . cetirizine (ZYRTEC) 10 MG tablet Take 10 mg by mouth daily as needed for allergies.       [name] clopidogrel (PLAVIX) 75 MG tablet Take 75 mg by mouth daily.        . DULoxetine (CYMBALTA) 30 MG capsule Take 30 mg by mouth daily.       [name] ezetimibe (ZETIA) 10 MG tablet Take 10 mg by mouth daily.        [name]  Fiber CAPS Take 1 capsule by mouth daily.       . fish oil-omega-3 fatty acids 1000 MG capsule Take 3 g by mouth daily.       . furosemide (LASIX) 40 MG tablet Take 1 tablet (40 mg total) by mouth daily.  30 tablet  6  . ketorolac (ACULAR) 0.5 % ophthalmic solution Place 1 drop into both eyes 4 (four) times daily.       [name] lisinopril-hydrochlorothiazide (PRINZIDE,ZESTORETIC) 20-12.5 MG per tablet Take 2 tablets by mouth.       . meloxicam (MOBIC) 7.5 MG tablet Take 7.5 mg by mouth daily.      . metoprolol succinate (TOPROL-XL) 25 MG 24 hr tablet Take 1 tablet (25 mg total) by mouth daily.  30  tablet  5  . montelukast (SINGULAIR) 10 MG tablet Take 10 mg by mouth daily.       . Multiple Vitamin (MULTIVITAMIN WITH MINERALS) TABS tablet Take 1 tablet by mouth daily.      . nitroGLYCERIN (NITROSTAT) 0.4 MG SL tablet Place 0.4 mg under the tongue every 5 (five) minutes as needed for chest pain.      . pantoprazole (PROTONIX) 40 MG tablet Take 40 mg by mouth daily.       . rosuvastatin (CRESTOR) 5 MG tablet Take 5 mg by mouth daily.       No current facility-administered medications for this visit.    Allergies  Allergen Reactions  . Statins     Unable to tolerate high dose statins    History   Social History  . Marital Status: Married    Spouse Name: N/A    Number of Children: N/A  . Years of Education: N/A   Occupational History  . Not on file.   Social History Main Topics  . Smoking status: Former Smoker    Types: Cigarettes    Quit date: 06/01/1991  . Smokeless tobacco: Not on file  . Alcohol Use: No  . Drug Use:   . Sexual Activity:    Other Topics Concern  . Not on file   Social History Narrative  . No narrative on file     Review of Systems: General: negative for chills, fever, night sweats or weight changes.  Cardiovascular: negative for chest pain, dyspnea on exertion, edema, orthopnea, palpitations, paroxysmal nocturnal dyspnea or shortness of breath Dermatological: negative for rash Respiratory: negative for cough or wheezing Urologic: negative for hematuria Abdominal: negative for nausea, vomiting, diarrhea, bright red blood per rectum, melena, or hematemesis Neurologic: negative for visual changes, syncope, or dizziness All other systems reviewed and are otherwise negative except as noted above.    Blood pressure 138/94, pulse 78, height 5' 8.5" (1.74 m), weight 281 lb (127.461 kg).  General appearance: alert and no distress Neck: no adenopathy, no carotid bruit, no JVD, supple, symmetrical, trachea midline and thyroid not enlarged,  symmetric, no tenderness/mass/nodules Lungs: clear to auscultation bilaterally Heart: regular rate and rhythm, S1, S2 normal, no murmur, click, rub or gallop Extremities: extremities normal, atraumatic, no cyanosis or edema  EKG normal sinus rhythm at 78 with long first degree AV block  ASSESSMENT AND PLAN:   CAD RCA, PCI DES 4/11 Mr. Lacy Duverney underwent diagnostic coronary arteriography via the right femoral approach on 11/21/12 revealing unchanged anatomy from his prior cath. His RCA stent was widely patent and his posterolateral branch was chronically totally occluded. He had normal LV function. There is nothing on a scaffold explain his symptoms.  He complains of dyspnea and fatigue but denies chest pain  Wenckebach second degree AV block He underwent outpatient event monitoring which showed sinus tachycardia and second-degree AV block. He is on low-dose beta-blockade. I am going to refer him to one of our electrophysiologists for further evaluation.  Hyperlipidemia At goal for secondary prevention on: Statin therapy  Hypertension  Borderline controlled on antihypertensive medications      Runell Gess MD [facility], [facility] 12/29/2012 8:41 AM

## 2012-12-29 NOTE — Assessment & Plan Note (Signed)
He underwent outpatient event monitoring which showed sinus tachycardia and second-degree AV block. He is on low-dose beta-blockade. I am going to refer him to one of our electrophysiologists for further evaluation.

## 2012-12-29 NOTE — Assessment & Plan Note (Signed)
At goal for secondary prevention on: Statin therapy

## 2012-12-29 NOTE — Patient Instructions (Signed)
Your physician wants you to follow-up in: 6 months with Dr Allyson Sabal. You will receive a reminder letter in the mail two months in advance. If you don't receive a letter, please call our office to schedule the follow-up appointment.  We have referred you to see an electrophysilogists (Dr Tommie Sams)

## 2012-12-29 NOTE — Assessment & Plan Note (Signed)
Mr. Jacob Romero underwent diagnostic coronary arteriography via the right femoral approach on 11/21/12 revealing unchanged anatomy from his prior cath. His RCA stent was widely patent and his posterolateral branch was chronically totally occluded. He had normal LV function. There is nothing on a scaffold explain his symptoms. He complains of dyspnea and fatigue but denies chest pain

## 2013-01-04 ENCOUNTER — Encounter: Payer: Self-pay | Admitting: Cardiovascular Disease

## 2013-01-11 ENCOUNTER — Encounter: Payer: Self-pay | Admitting: Internal Medicine

## 2013-01-11 ENCOUNTER — Ambulatory Visit (INDEPENDENT_AMBULATORY_CARE_PROVIDER_SITE_OTHER): Payer: 59 | Admitting: Internal Medicine

## 2013-01-11 VITALS — BP 142/98 | HR 80 | Ht 68.5 in | Wt 280.8 lb

## 2013-01-11 DIAGNOSIS — I10 Essential (primary) hypertension: Secondary | ICD-10-CM

## 2013-01-11 DIAGNOSIS — R0602 Shortness of breath: Secondary | ICD-10-CM | POA: Insufficient documentation

## 2013-01-11 DIAGNOSIS — I441 Atrioventricular block, second degree: Secondary | ICD-10-CM

## 2013-01-11 DIAGNOSIS — R5383 Other fatigue: Secondary | ICD-10-CM

## 2013-01-11 DIAGNOSIS — R5381 Other malaise: Secondary | ICD-10-CM

## 2013-01-11 NOTE — Patient Instructions (Addendum)
Your physician recommends that you schedule a follow-up appointment in: 01/22/13 with Dr Johney Frame at 12:30    Your physician has recommended that you have a cardiopulmonary stress test (CPX). CPX testing is a non-invasive measurement of heart and lung function. It replaces a traditional treadmill stress test. This type of test provides a tremendous amount of information that relates not only to your present condition but also for future outcomes. This test combines measurements of you ventilation, respiratory gas exchange in the lungs, electrocardiogram (EKG), blood pressure and physical response before, during, and following an exercise protocol.---on Monday  Try and decrease your Mobic to once daily  Your physician has recommended you make the following change in your medication:  1) Stop Toprol

## 2013-01-11 NOTE — Progress Notes (Signed)
Primary Care Physician: Robb Matar, MD Referring Physician:  Dr Tarri Abernethy VANDELL KUN is a 62 y.o. male with a h/o coronary artery disease s/p PTCA in 2011 after attempted PCI resulted in subarachnoid hemorrhage.  His other past medical history is significant for morbid obesity, hypertension, PVD (s/p popliteal bypass), and sleep apnea on CPAP.  He was in his usual state of health until the end of May 2014 at which time he developed shortness of breath, exercise intolerance and pre-syncopal spells.  He has been evaluated with echocardiogram which demonstrated an EF of 45-50% with moderate hypokinesis of the inferior myocardium, mild MR, and moderately dilated LA (47).  Repeat catheterization in August of 2014 demonstrated patent RCA stent with normal LV function and otherwise unchanged coronary anatomy.  He has also been evaluated with event monitor which demonstrated occasional Mobitz I heart block, SR with 1st degree AV block and ST during his symptoms.    He has spells of dizziness and shortness of breath that occur 8-10 times per day.  These last for 30 seconds to 10 minutes and are by deep breathing.  He has had one episode of frank syncope about 6 weeks ago that occurred during strenuous activity. He had his normal prodrome with this episode but was not able to sit down in time.    He is on Metoprolol for hypertension and coronary artery disease and has been for several years.  He had no changes in his medications around the onset of his symptoms.    Past Medical History  Diagnosis Date  . Hyperlipidemia   . Hypertension   . Depression   . GERD (gastroesophageal reflux disease)   . Hiatal hernia   . CAD (coronary artery disease) 2011    RCA HSRA, known occlued PDA  . Sleep apnea     on C-pap  . PVD (peripheral vascular disease)     Rt FPBPG  . Dyspnea on exertion   . Bilateral lower extremity edema   . Wenckebach second degree AV block    Past Surgical History  Procedure  Laterality Date  . Pr vein bypass graft,aorto-fem-pop    . Angioplasty  Feb 2011    aborted PCI secondary to [facility]  . Angioplasty  April 2011    RCA HSRA    Current Outpatient Prescriptions  Medication Sig Dispense Refill  . acetaminophen (TYLENOL) 650 MG CR tablet Take 650 mg by mouth every 8 (eight) hours as needed for pain.      . Acetaminophen-Caffeine (EXCEDRIN TENSION HEADACHE PO) Take 2 tablets by mouth daily as needed (tension headache).       . ADVAIR DISKUS 250-50 MCG/DOSE AEPB Inhale 1 puff into the lungs 2 (two) times daily.      [name] albuterol (PROVENTIL HFA;VENTOLIN HFA) 108 (90 BASE) MCG/ACT inhaler Inhale 2 puffs into the lungs every 6 (six) hours as needed for wheezing or shortness of breath.       [name] aspirin EC 81 MG tablet Take 81 mg by mouth daily.        . Aspirin-Acetaminophen-Caffeine (EXCEDRIN MIGRAINE PO) Take 1 tablet by mouth daily as needed (migraine).       . Azelastine-Fluticasone (DYMISTA) 137-50 MCG/ACT SUSP Place 1 spray into the nose daily as needed (seasonal allergies).       . clopidogrel (PLAVIX) 75 MG tablet Take 75 mg by mouth daily.        . DULoxetine (CYMBALTA) 30 MG capsule Take 30 mg by  mouth daily.       [name] ezetimibe (ZETIA) 10 MG tablet Take 10 mg by mouth daily.        . Fiber CAPS Take 16 capsules by mouth daily.       . fish oil-omega-3 fatty acids 1000 MG capsule Take 3 g by mouth daily.       . furosemide (LASIX) 40 MG tablet Take 1 tablet (40 mg total) by mouth daily.  30 tablet  6  . lisinopril-hydrochlorothiazide (PRINZIDE,ZESTORETIC) 20-12.5 MG per tablet Take 2 tablets by mouth.       . loratadine (CLARITIN) 10 MG tablet Take 10 mg by mouth daily.      . meloxicam (MOBIC) 7.5 MG tablet Take 7.5 mg by mouth daily.      . metoprolol succinate (TOPROL-XL) 25 MG 24 hr tablet Take 1 tablet (25 mg total) by mouth daily.  30 tablet  5  . montelukast (SINGULAIR) 10 MG tablet Take 10 mg by mouth as needed.       . Multiple Vitamin (MULTIVITAMIN WITH  MINERALS) TABS tablet Take 1 tablet by mouth daily.      . nitroGLYCERIN (NITROSTAT) 0.4 MG SL tablet Place 0.4 mg under the tongue every 5 (five) minutes as needed for chest pain.      . pantoprazole (PROTONIX) 40 MG tablet Take 40 mg by mouth daily.       . rosuvastatin (CRESTOR) 5 MG tablet Take 5 mg by mouth 2 (two) times a week. 2 times weekly.       No current facility-administered medications for this visit.    Allergies  Allergen Reactions  . Statins     Unable to tolerate high dose statins    History   Social History  . Marital Status: Married    Spouse Name: N/A    Number of Children: N/A  . Years of Education: N/A   Occupational History  . Not on file.   Social History Main Topics  . Smoking status: Former Smoker    Types: Cigarettes    Quit date: 06/01/1991  . Smokeless tobacco: Not on file  . Alcohol Use: No  . Drug Use:   . Sexual Activity:    Other Topics Concern  . Not on file   Social History Narrative  . No narrative on file    Family History  Problem Relation Age of Onset  . Adopted: Yes    ROS- All systems are reviewed and negative except as per the HPI above  Physical Exam: Filed Vitals:   01/11/13 1216  BP: 142/98  Pulse: 80  Height: 5' 8.5" (1.74 m)  Weight: 280 lb 12.8 oz (127.37 kg)    GEN- The patient is overweight appearing, alert and oriented x 3 today.   Head- normocephalic, atraumatic Eyes-  Sclera clear, conjunctiva pink Ears- hearing intact Oropharynx- clear Neck- supple, no JVP Lymph- no cervical lymphadenopathy Lungs- Clear to ausculation bilaterally, normal work of breathing Heart- Regular rate and rhythm, no murmurs, rubs or gallops, PMI not laterally displaced GI- soft, NT, ND, + BS Extremities- no clubbing, cyanosis, or edema MS- no significant deformity or atrophy Skin- no rash or lesion Psych- euthymic mood, full affect Neuro- strength and sensation are intact  EKG- sinus rhythm with first degree AV block  (PR 246 msec), LVH, LAD Event monitor is reviewed and reveals sinus rhythm with first degree AV block and mobitz I second degree AV block Epic records and Dr Renelda Mom notes are  reviewed  Assessment and Plan:  1. SOB and fatigue Unclear etiology and possibly multifactoral.  I am not convincd that this is due to second degree AV block.  Review of EKGs reveals that PR prolongation has preceded his symptoms.  Chronic obstructive lung disease, obesity, and deconditioning could also be part of this.  I will stop toprol at this time. CPX to evaluate his SOB further.  If CPX is suggestive of a dominant cardiac/ chronotropic problem then further Ep intervention may be necessary.  2. Mobitz I second degree AV block I am not certain that this is the cause for his symptoms.  I will stop toprol and follow cpx as above  3. Chronic difficulty with edema and hypertension Possibly exacerbated by chronic daily NSAIDs I have advised that he significantly reduce his NSAID consumption and use mobic only as absolutely necessary.  Perhaps his antihypertensives could be weaned and his diuretics reduced with this approach.  4. Presyncope Unclear etiology.  Given that he is on both hctz and lasix, I am concerned that his postural dizziness/ presyncope may be due to intravascular depletion.  Consider stopping hctz.  As above, I think that if we reduced his NSAIDs then we could get him off of some of his medicine.  Return to see me in 1 week following his CPX.

## 2013-01-15 ENCOUNTER — Telehealth: Payer: Self-pay | Admitting: *Deleted

## 2013-01-16 ENCOUNTER — Ambulatory Visit (HOSPITAL_COMMUNITY): Payer: 59 | Attending: Internal Medicine

## 2013-01-16 DIAGNOSIS — R0602 Shortness of breath: Secondary | ICD-10-CM

## 2013-01-16 NOTE — Progress Notes (Signed)
Thanks James.  JJB 

## 2013-01-16 NOTE — Telephone Encounter (Signed)
Message forwarded to K. Vogel, RN.  

## 2013-01-18 DIAGNOSIS — R0602 Shortness of breath: Secondary | ICD-10-CM

## 2013-01-22 ENCOUNTER — Encounter: Payer: Self-pay | Admitting: Internal Medicine

## 2013-01-22 ENCOUNTER — Telehealth: Payer: Self-pay | Admitting: Cardiovascular Disease

## 2013-01-22 ENCOUNTER — Ambulatory Visit (INDEPENDENT_AMBULATORY_CARE_PROVIDER_SITE_OTHER): Payer: 59 | Admitting: Internal Medicine

## 2013-01-22 VITALS — BP 117/92 | HR 116 | Ht 68.0 in | Wt 278.0 lb

## 2013-01-22 DIAGNOSIS — I10 Essential (primary) hypertension: Secondary | ICD-10-CM

## 2013-01-22 DIAGNOSIS — R5383 Other fatigue: Secondary | ICD-10-CM

## 2013-01-22 DIAGNOSIS — R0602 Shortness of breath: Secondary | ICD-10-CM

## 2013-01-22 DIAGNOSIS — I441 Atrioventricular block, second degree: Secondary | ICD-10-CM

## 2013-01-22 DIAGNOSIS — R5381 Other malaise: Secondary | ICD-10-CM

## 2013-01-22 MED ORDER — FUROSEMIDE 40 MG PO TABS: 20.0000 mg | ORAL_TABLET | Freq: Every day | ORAL | Status: DC

## 2013-01-22 MED ORDER — LISINOPRIL 20 MG PO TABS: 20.0000 mg | ORAL_TABLET | Freq: Every day | ORAL | Status: DC

## 2013-01-22 NOTE — Telephone Encounter (Signed)
Dr Johney Frame sending FMLA papers back to Dr Allyson Sabal  She needs to talk to [facility] before they are signed.  Please call

## 2013-01-22 NOTE — Progress Notes (Signed)
PCP:  Robb Matar, MD Primary Cardiologist:  Dr Allyson Sabal   The patient presents today for routine electrophysiology followup.  Since last being seen in our clinic, the patient reports doing reasonably well.  He stopped his mobic and noticed significant improvement in edema.  He continues to have occasional postural dizziness but denies presyncope or syncope.   Today, he denies symptoms of palpitations, chest pain, or neurologic sequela.  His exertional SOB is mild and unchanged. The patient feels that he is tolerating medications without difficulties and is otherwise without complaint today.   Past Medical History  Diagnosis Date  . Hyperlipidemia   . Hypertension   . Depression   . GERD (gastroesophageal reflux disease)   . Hiatal hernia   . CAD (coronary artery disease) 2011    RCA HSRA, known occlued PDA  . Sleep apnea     on C-pap  . PVD (peripheral vascular disease)     Rt FPBPG  . Dyspnea on exertion   . Bilateral lower extremity edema   . Wenckebach second degree AV block    Past Surgical History  Procedure Laterality Date  . Pr vein bypass graft,aorto-fem-pop    . Angioplasty  Feb 2011    aborted PCI secondary to [facility]  . Angioplasty  April 2011    RCA HSRA    Current Outpatient Prescriptions  Medication Sig Dispense Refill  . acetaminophen (TYLENOL) 650 MG CR tablet Take 650 mg by mouth every 8 (eight) hours as needed for pain.      . Acetaminophen-Caffeine (EXCEDRIN TENSION HEADACHE PO) Take 2 tablets by mouth daily as needed (tension headache).       . ADVAIR DISKUS 250-50 MCG/DOSE AEPB Inhale 1 puff into the lungs 2 (two) times daily.      [name] albuterol (PROVENTIL HFA;VENTOLIN HFA) 108 (90 BASE) MCG/ACT inhaler Inhale 2 puffs into the lungs every 6 (six) hours as needed for wheezing or shortness of breath.       [name] aspirin EC 81 MG tablet Take 81 mg by mouth daily.        . Aspirin-Acetaminophen-Caffeine (EXCEDRIN MIGRAINE PO) Take 1 tablet by mouth daily as needed  (migraine).       . Azelastine-Fluticasone (DYMISTA) 137-50 MCG/ACT SUSP Place 1 spray into the nose daily as needed (seasonal allergies).       . clopidogrel (PLAVIX) 75 MG tablet Take 75 mg by mouth daily.        . DULoxetine (CYMBALTA) 30 MG capsule Take 90 mg by mouth daily. Take 3 tablets daily.      [name] ezetimibe (ZETIA) 10 MG tablet Take 10 mg by mouth daily.        . Fiber CAPS Take 16 capsules by mouth daily.       . fish oil-omega-3 fatty acids 1000 MG capsule Take 3 g by mouth daily.       . furosemide (LASIX) 40 MG tablet Take 0.5 tablets (20 mg total) by mouth daily.  30 tablet  6  . loratadine (CLARITIN) 10 MG tablet Take 10 mg by mouth daily.      . meloxicam (MOBIC) 7.5 MG tablet Take 7.5 mg by mouth daily.      . montelukast (SINGULAIR) 10 MG tablet Take 10 mg by mouth as needed.       . Multiple Vitamin (MULTIVITAMIN WITH MINERALS) TABS tablet Take 1 tablet by mouth daily.      . nitroGLYCERIN (NITROSTAT) 0.4 MG SL tablet Place  0.4 mg under the tongue every 5 (five) minutes as needed for chest pain.      . pantoprazole (PROTONIX) 40 MG tablet Take 40 mg by mouth daily.       . rosuvastatin (CRESTOR) 5 MG tablet Take 5 mg by mouth 2 (two) times a week. 2 times weekly.      [name] lisinopril (PRINIVIL,ZESTRIL) 20 MG tablet Take 1 tablet (20 mg total) by mouth daily.  90 tablet  3   No current facility-administered medications for this visit.    Allergies  Allergen Reactions  . Statins     Unable to tolerate high dose statins    History   Social History  . Marital Status: Married    Spouse Name: N/A    Number of Children: N/A  . Years of Education: N/A   Occupational History  . Not on file.   Social History Main Topics  . Smoking status: Former Smoker    Types: Cigarettes    Quit date: 06/01/1991  . Smokeless tobacco: Not on file  . Alcohol Use: No  . Drug Use: No  . Sexual Activity: Not on file   Other Topics Concern  . 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 Adopted: Yes    ROS-  All systems are reviewed and are negative except as outlined in the HPI above  Physical Exam: Filed Vitals:   01/22/13 1234  BP: 117/92  Pulse: 116  Height: 5\' 8"  (1.727 m)  Weight: 278 lb (126.1 kg)    GEN- The patient is well appearing, alert and oriented x 3 today.   Head- normocephalic, atraumatic Eyes-  Sclera clear, conjunctiva pink Ears- hearing intact Oropharynx- clear Neck- supple, no JVP Lymph- no cervical lymphadenopathy Lungs- Clear to ausculation bilaterally, normal work of breathing Heart- Regular rate and rhythm, no murmurs, rubs or gallops, PMI not laterally displaced GI- soft, NT, ND, + BS Extremities- no clubbing, cyanosis, or edema MS- no significant deformity or atrophy Skin- no rash or lesion Psych- euthymic mood, full affect Neuro- strength and sensation are intact  ekg today reveals sinus tachycardia with first degree AV block, LVH CPX is reviewed  Assessment and Plan:   1. SOB and fatigue CPX reviewed and is suggestive of obesity/ deconditioning as the primary cause.  I have encouraged regular exercise  2. Mobitz I second degree AV block I do not believe that this is responsible for his symptoms  3. Chronic difficulty with edema and hypertension Much improved off of meloxicam Stop hctz today Reduce lasix to 20mg  daily, consider stopping lasix if edema remains resolved  4. Presyncope Given that he is on both hctz and lasix, I am concerned that his postural dizziness/ presyncope may be due to intravascular depletion. I will stop hctz today and reduce lasix to 20mg  daily.  If his swelling/ BP do not worsen then stop lasix eventually  Follow-up with Dr Allyson Sabal  I will see as needed going forward

## 2013-01-22 NOTE — Patient Instructions (Signed)
Your physician recommends that you schedule a follow-up appointment as needed with Dr Johney Frame and move Dr Hazle Coca appointment out 3 months  Your physician has recommended you make the following change in your medication:  1) decrease Furosemide to 20mg  daily, if in 4 weeks no swelling stop 2) Stop Lisinopril/HCT  3) Start Lisinopril 20mg 

## 2013-01-22 NOTE — Telephone Encounter (Signed)
Pt contacted me through my chart

## 2013-01-26 NOTE — Telephone Encounter (Signed)
i spoke with Ms Trebilcock and will verify the information with Mr Sporer and she will call me back

## 2013-01-30 NOTE — Telephone Encounter (Signed)
Info received. Forms ready for pick up

## 2013-02-01 ENCOUNTER — Other Ambulatory Visit: Payer: Self-pay

## 2013-02-14 ENCOUNTER — Telehealth: Payer: Self-pay | Admitting: Cardiovascular Disease

## 2013-02-14 MED ORDER — EZETIMIBE 10 MG PO TABS: 10.0000 mg | ORAL_TABLET | Freq: Every day | ORAL | Status: DC

## 2013-02-14 NOTE — Telephone Encounter (Signed)
Returned call and pt informed samples left at front desk.  Pt verbalized understanding and agreed w/ plan.    

## 2013-02-14 NOTE — Telephone Encounter (Signed)
Would like some samples of Zetia 10 mg please. °

## 2013-03-14 ENCOUNTER — Other Ambulatory Visit: Payer: Self-pay | Admitting: *Deleted

## 2013-03-14 MED ORDER — CLOPIDOGREL BISULFATE 75 MG PO TABS: 75.0000 mg | ORAL_TABLET | Freq: Every day | ORAL | Status: DC

## 2013-03-14 NOTE — Telephone Encounter (Signed)
Rx was sent to pharmacy electronically. 

## 2013-03-20 ENCOUNTER — Other Ambulatory Visit: Payer: Self-pay | Admitting: *Deleted

## 2013-03-20 MED ORDER — PANTOPRAZOLE SODIUM 40 MG PO TBEC: 40.0000 mg | DELAYED_RELEASE_TABLET | Freq: Every day | ORAL | Status: DC

## 2013-03-20 NOTE — Telephone Encounter (Signed)
Rx was sent to pharmacy electronically. 

## 2013-04-03 ENCOUNTER — Encounter: Payer: Self-pay | Admitting: Cardiovascular Disease

## 2013-04-03 ENCOUNTER — Ambulatory Visit (INDEPENDENT_AMBULATORY_CARE_PROVIDER_SITE_OTHER): Payer: 59 | Admitting: Cardiovascular Disease

## 2013-04-03 VITALS — BP 144/92 | HR 89 | Ht 68.5 in | Wt 289.0 lb

## 2013-04-03 DIAGNOSIS — I441 Atrioventricular block, second degree: Secondary | ICD-10-CM

## 2013-04-03 DIAGNOSIS — E785 Hyperlipidemia, unspecified: Secondary | ICD-10-CM

## 2013-04-03 DIAGNOSIS — G4730 Sleep apnea, unspecified: Secondary | ICD-10-CM

## 2013-04-03 DIAGNOSIS — I10 Essential (primary) hypertension: Secondary | ICD-10-CM

## 2013-04-03 DIAGNOSIS — I2510 Atherosclerotic heart disease of native coronary artery without angina pectoris: Secondary | ICD-10-CM

## 2013-04-03 NOTE — Assessment & Plan Note (Signed)
His zetia was recently discontinued by his primary care physician who is following his lipid profile closely as an outpatient

## 2013-04-03 NOTE — Patient Instructions (Signed)
Your physician wants you to follow-up in: 6 months with an extender and 1 year with Dr Gwenlyn Found. You will receive a reminder letter in the mail two months in advance. If you don't receive a letter, please call our office to schedule the follow-up appointment.  Dr Gwenlyn Found has referred you to [facility], a nutritionist.  Her office will contact you.

## 2013-04-03 NOTE — Assessment & Plan Note (Signed)
I referred Mr. Jacob Romero to Dr. Thompson Grayer for electric electrophysiology evaluation because of the key block and potential related symptoms of dyspnea and fatigue.his beta blocker was discontinued. His Mobic was stopped as well. His symptoms improved. A cardiopulmonary exercise test was performed this suggested his dyspnea is related to obesity and inactivity.

## 2013-04-03 NOTE — Assessment & Plan Note (Addendum)
On CPAP which he derives  benefit from

## 2013-04-03 NOTE — Progress Notes (Signed)
04/03/2013 Jacob Romero   Jan 09, 1951  [id number]  Primary Physician Quintella Reichert, MD Primary Cardiologist: Lorretta Harp MD [name]   HPI:  The patient is a very pleasant 63 year old severely overweight married Caucasian male father of 2, grandfather of 2 grandchildren who I saw 31 months ago and who is accompanied by his wife today. He has a history remarkable for hypertension, hyperlipidemia and known CAD. He does have sleep apnea on CPAP. He had above-to-below the knee popliteal bypass grafting by Dr. [name] after an occluded distal right SFA stent which he is now asymptomatic from and which we get annual Dopplers. I cathed him May 15, 2009 via the right radial approach revealing a high-grade mid RCA stenosis with a totally occluded PLA. There was an anterior takeoff. I attempted recanalization, however, he developed a subarachnoid hemorrhage and the procedure was aborted. Dr. Claiborne Billings brought him back and performed high-speed rotational atherectomy, PCI and stenting of his mid RCA but was unable to recanalize the PLA branch. He is completely asymptomatic with regards to this. He has got an excellent lipid profile for secondary prevention, followed closely by Dr. Micheal Likens. His total cholesterol is 140, LDL of 74 and HDL of 45.  I saw him back in the office 05/05/12 at which time he was currently stable. For the last several months he developed progressive dyspnea on exertion fatigue and lower extreme edema. The dyspnea was similar to his symptoms prior to intervention in the past.I obtained a 2-D echo which showed mild to moderate LV dysfunction with an EF in the 45% range and inferior hypokinesia. A Myoview stress test likewise showed LV dysfunction. Because of this I performed cardiac catheterization on him 11/21/12 revealing a patent mid RCA stent with an occluded posterolateral branch which is chronic. His EF was 60%. He continues to be symptomatic. The monitor showed sinus  rhythm, sinus tach and second degree AV block. I referred him to Dr. Thompson Grayer who adjusted his medications. He discontinued his beta blocker and his Mobic. He performed a cardiopulmonary exercise testing which suggested that his symptoms were related to deconditioning and obesity. The patient was clinically improved. I suggested that he seek the potential to dietitian for weight loss.    Current Outpatient Prescriptions  Medication Sig Dispense Refill  . acetaminophen (TYLENOL) 650 MG CR tablet Take 650 mg by mouth every 8 (eight) hours as needed for pain.      . Acetaminophen-Caffeine (EXCEDRIN TENSION HEADACHE PO) Take 2 tablets by mouth daily as needed (tension headache).       . ADVAIR DISKUS 250-50 MCG/DOSE AEPB Inhale 1 puff into the lungs 2 (two) times daily.      [name] albuterol (PROVENTIL HFA;VENTOLIN HFA) 108 (90 BASE) MCG/ACT inhaler Inhale 2 puffs into the lungs every 6 (six) hours as needed for wheezing or shortness of breath.       [name] amLODipine-olmesartan (AZOR) 5-40 MG per tablet Take 1 tablet by mouth daily.      [name] aspirin EC 81 MG tablet Take 81 mg by mouth daily.        . Aspirin-Acetaminophen-Caffeine (EXCEDRIN MIGRAINE PO) Take 1 tablet by mouth daily as needed (migraine).       . Azelastine-Fluticasone (DYMISTA) 137-50 MCG/ACT SUSP Place 1 spray into the nose daily as needed (seasonal allergies).       . clopidogrel (PLAVIX) 75 MG tablet Take 1 tablet (75 mg total) by mouth daily.  30 tablet  9  .  DULoxetine (CYMBALTA) 30 MG capsule Take 90 mg by mouth daily. Take 3 tablets daily.      . Fiber CAPS Take 16 capsules by mouth daily.       . fish oil-omega-3 fatty acids 1000 MG capsule Take 3 g by mouth daily.       . furosemide (LASIX) 40 MG tablet Take 0.5 tablets (20 mg total) by mouth daily.  30 tablet  6  . montelukast (SINGULAIR) 10 MG tablet Take 10 mg by mouth as needed.       . Multiple Vitamin (MULTIVITAMIN WITH MINERALS) TABS tablet Take 1 tablet by mouth daily.        . nabumetone (RELAFEN) 750 MG tablet       . nitroGLYCERIN (NITROSTAT) 0.4 MG SL tablet Place 0.4 mg under the tongue every 5 (five) minutes as needed for chest pain.      . pantoprazole (PROTONIX) 40 MG tablet Take 1 tablet (40 mg total) by mouth daily.  30 tablet  10  . rosuvastatin (CRESTOR) 5 MG tablet Take 5 mg by mouth 2 (two) times a week. 2 times weekly.      . TESTIM 50 MG/5GM GEL        No current facility-administered medications for this visit.    Allergies  Allergen Reactions  . Statins     Unable to tolerate high dose statins    History   Social History  . Marital Status: Married    Spouse Name: N/A    Number of Children: N/A  . Years of Education: N/A   Occupational History  . Not on file.   Social History Main Topics  . Smoking status: Former Smoker    Types: Cigarettes    Quit date: 06/01/1991  . Smokeless tobacco: Not on file  . Alcohol Use: No  . Drug Use: No  . Sexual Activity: Not on file   Other Topics Concern  . 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  Cardiovascular: negative for chest pain, dyspnea on exertion, edema, orthopnea, palpitations, paroxysmal nocturnal dyspnea or shortness of breath Dermatological: negative for rash Respiratory: negative for cough or wheezing Urologic: negative for hematuria Abdominal: negative for nausea, vomiting, diarrhea, bright red blood per rectum, melena, or hematemesis Neurologic: negative for visual changes, syncope, or dizziness All other systems reviewed and are otherwise negative except as noted above.    Blood pressure 144/92, pulse 89, height 5' 8.5" (1.74 m), weight 289 lb (131.09 kg).  General appearance: alert and no distress Neck: no adenopathy, no carotid bruit, no JVD, supple, symmetrical, trachea midline and thyroid not enlarged, symmetric, no  tenderness/mass/nodules Lungs: clear to auscultation bilaterally Heart: regular rate and rhythm, S1, S2 normal, no murmur, click, rub or gallop Extremities: extremities normal, atraumatic, no cyanosis or edema  EKG social 77 with first degree AV block and evidence of left ventricular hypertrophy  ASSESSMENT AND PLAN:   Wenckebach second degree AV block I referred Jacob Romero to Dr. Thompson Grayer for electric electrophysiology evaluation because of the key block and potential related symptoms of dyspnea and fatigue.his beta blocker was discontinued. His Mobic was stopped as well. His symptoms improved. A cardiopulmonary exercise test was performed this suggested his dyspnea is related to obesity and inactivity.  Hypertension Borderline controlled on current medications  Hyperlipidemia His zetia was  recently discontinued by his primary care physician who is following his lipid profile closely as an outpatient  Sleep apnea On CPAP which he derives  benefit from      Lorretta Harp MD [facility], [facility] 04/03/2013 2:48 PM

## 2013-04-03 NOTE — Assessment & Plan Note (Signed)
Borderline controlled on current medications

## 2013-04-04 ENCOUNTER — Encounter: Payer: Self-pay | Admitting: Cardiovascular Disease

## 2013-04-20 ENCOUNTER — Telehealth: Payer: Self-pay | Admitting: Cardiovascular Disease

## 2013-04-20 ENCOUNTER — Other Ambulatory Visit: Payer: Self-pay | Admitting: Orthopedic Surgery

## 2013-04-20 NOTE — Telephone Encounter (Signed)
I spoke with Dr Daylene Katayama.  He wants to do rotator cuff surgery on Jacob Romero next week.  He wants to know if Jacob Romero could hold his ASA and Plavix starting today.   I spoke with Dr Debara Pickett. Jacob Romero can hold asa and plavix starting today and Dr Gwenlyn Found can look at cardiac clearance on Monday.  I let Dr Daylene Katayama know.  I will defer to Dr Gwenlyn Found for clearance on Monday.

## 2013-04-20 NOTE — Telephone Encounter (Signed)
Please have Curt Bears call her.Gershon Mussel is going to have shoulder surgery.

## 2013-04-20 NOTE — Telephone Encounter (Signed)
I spoke with Dr Daylene Katayama.  I told him that Dr Debara Pickett okayed the plavix and asa to be stopped.  Dr Daylene Katayama said Jacob Romero could continue the asa.  I will contact patient to let him know.

## 2013-04-20 NOTE — Telephone Encounter (Signed)
I spoke with Jacob Romero and let her know for Jacob Romero to stop his plavix

## 2013-04-23 ENCOUNTER — Encounter (HOSPITAL_BASED_OUTPATIENT_CLINIC_OR_DEPARTMENT_OTHER): Payer: Self-pay | Admitting: *Deleted

## 2013-04-23 NOTE — Progress Notes (Signed)
Pt to come for bmet-to bring all meds cpap, overnight bag-waiting on clearance from dr berry-

## 2013-04-24 ENCOUNTER — Encounter: Payer: Self-pay | Admitting: *Deleted

## 2013-04-24 NOTE — Progress Notes (Signed)
Reviewed notes with dr crews-says he is ok to be done here Needs to stay overnight

## 2013-04-24 NOTE — Telephone Encounter (Signed)
Clearance was faxed to Dr Daylene Katayama

## 2013-04-26 NOTE — H&P (Signed)
Jacob Romero is an 63 y.o. male.   Chief Complaint: c/o chronic and progressive right shoulder pain and decreased ROM HPI: Jacob Romero, Jacob Romero. 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  He is on light duty after undergoing repair of long finger flexor retinaculum reconstructions, right side by Dr. Fredna Dow, left side by Dr. Burney Gauze.  He has rehabilitated both hands.  He returned to work with a ten pound lifting restriction.  He has had increasing pain in his right shoulder, aggravated by forward flexion and scaption, he has difficulty reaching behind his back putting a sleeve on and cannot sleep on the right side at night.    Past Medical History  Diagnosis Date  . Hyperlipidemia   . Hypertension   . Depression   . GERD (gastroesophageal reflux disease)   . Hiatal hernia   . CAD (coronary artery disease) 2011    RCA HSRA, known occlued PDA  . Sleep apnea     on C-pap  . PVD (peripheral vascular disease)     Rt FPBPG  . Dyspnea on exertion   . Bilateral lower extremity edema   . Wenckebach second degree AV block     Past Surgical History  Procedure Laterality Date  . Pr vein bypass graft,aorto-fem-pop  2007-2008    multiple rt fem-pop bpg and stents  . Angioplasty  Feb 2011    aborted PCI secondary to [facility]  . Angioplasty  April 2011    RCA HSRA  . Cardiac catheterization  [id number]    stents  . Tonsillectomy    . Colonoscopy    . Finger arthroplasty      both middle fingers  . Cystoscopy w/ ureteroscopy w/ lithotripsy      Family History  Problem Relation Age of Onset  . Adopted: Yes   Social History:  reports that he quit smoking about 21 years ago. His smoking use included Cigarettes. He smoked 0.00 packs per day. He does not have any smokeless tobacco history on file. He reports that he does not drink alcohol or use illicit drugs.  Allergies:  Allergies  Allergen Reactions  . Statins     Unable to  tolerate high dose statins    No prescriptions prior to admission    No results found for this or any previous visit (from the past 48 hour(s)).  No results found.   Pertinent items are noted in HPI.  There were no vitals taken for this visit.  General appearance: alert Head: Normocephalic, without obvious abnormality Neck: supple, symmetrical, trachea midline Resp: clear to auscultation bilaterally Cardio: regular rate and rhythm GI: normal findings: bowel sounds normal Extremities: Exam reveals a robust 63 year-old gentleman.  His shoulder range of motion reveals combined elevation right shoulder 160, left shoulder 175, external rotation right shoulder 90 degrees abduction 85 degrees, left shoulder 90 degrees.  He can internally rotate to at least T-12 on the right and  T-10 on the left.  He has pain with resisted scaption, elevation and a positive Speed's test.  He has pain with resisted external rotation at neutral.   His examination is compatible with internal derangement of the shoulder, possibly long head of biceps injury and possibly rotator cuff tear.  RADIOGRAPHS:   Plain films of his shoulder demonstrate minimal degenerative changes of the AC joint. He does not show signs of glenohumeral degenerative arthritis or calcific tendinopathy.   The MRI of his right shoulder  was obtained at [facility] on 04-14-13. He has a massive probably acute on chronic tear of the rotator cuff.  He also has an upper subscapularis tendinopathy. 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 He does not have significant muscle atrophy. His infraspinatus and teres minor are piled up behind the humeral head.    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  This is a severe acute on chronic tear.  Mr.  Wilk has lost abduction and  scaption function.  Plan: To the OR for right SA with SAD/DCR with [facility] repair as the anatomic predicament allows. The procedure, risks,benefits and post-op course were discussed with the patient at length and they were in agreement with the plan. The planned procedure is high risk from a medical standpoint and may only be able to salvage some shoulder range of motion and strength. Dr. Gwenlyn Found has sanctioned holding Plavix for 8 days pre operatively.  DASNOIT,Jacob Romero 04/26/2013, 12:37 PM  H&P documentation: 04/27/2013  -History and Physical Reviewed  -Patient has been re-examined  -No change in the plan of care  Cammie Sickle, MD

## 2013-04-26 NOTE — Discharge Instructions (Signed)

## 2013-04-27 ENCOUNTER — Ambulatory Visit (HOSPITAL_BASED_OUTPATIENT_CLINIC_OR_DEPARTMENT_OTHER): Payer: 59 | Admitting: Anesthesiology

## 2013-04-27 ENCOUNTER — Encounter (HOSPITAL_BASED_OUTPATIENT_CLINIC_OR_DEPARTMENT_OTHER): Payer: 59 | Admitting: Anesthesiology

## 2013-04-27 ENCOUNTER — Ambulatory Visit (HOSPITAL_BASED_OUTPATIENT_CLINIC_OR_DEPARTMENT_OTHER)
Admission: RE | Admit: 2013-04-27 | Discharge: 2013-04-28 | Disposition: A | Discharge status: Home / Self Care | Payer: 59 | Source: Ambulatory Visit | Attending: Orthopedic Surgery | Admitting: Orthopedic Surgery

## 2013-04-27 ENCOUNTER — Encounter (HOSPITAL_BASED_OUTPATIENT_CLINIC_OR_DEPARTMENT_OTHER)
Admission: RE | Disposition: A | Discharge status: Home / Self Care | Payer: Self-pay | Source: Ambulatory Visit | Attending: Orthopedic Surgery

## 2013-04-27 ENCOUNTER — Encounter (HOSPITAL_BASED_OUTPATIENT_CLINIC_OR_DEPARTMENT_OTHER): Payer: Self-pay | Admitting: *Deleted

## 2013-04-27 DIAGNOSIS — X58XXXA Exposure to other specified factors, initial encounter: Secondary | ICD-10-CM | POA: Insufficient documentation

## 2013-04-27 DIAGNOSIS — G4730 Sleep apnea, unspecified: Secondary | ICD-10-CM | POA: Insufficient documentation

## 2013-04-27 DIAGNOSIS — F329 Major depressive disorder, single episode, unspecified: Secondary | ICD-10-CM | POA: Insufficient documentation

## 2013-04-27 DIAGNOSIS — K219 Gastro-esophageal reflux disease without esophagitis: Secondary | ICD-10-CM | POA: Insufficient documentation

## 2013-04-27 DIAGNOSIS — F3289 Other specified depressive episodes: Secondary | ICD-10-CM | POA: Insufficient documentation

## 2013-04-27 DIAGNOSIS — M75120 Complete rotator cuff tear or rupture of unspecified shoulder, not specified as traumatic: Secondary | ICD-10-CM | POA: Diagnosis present

## 2013-04-27 DIAGNOSIS — I441 Atrioventricular block, second degree: Secondary | ICD-10-CM | POA: Insufficient documentation

## 2013-04-27 DIAGNOSIS — Z5333 Arthroscopic surgical procedure converted to open procedure: Secondary | ICD-10-CM | POA: Insufficient documentation

## 2013-04-27 DIAGNOSIS — S43429A Sprain of unspecified rotator cuff capsule, initial encounter: Secondary | ICD-10-CM | POA: Insufficient documentation

## 2013-04-27 DIAGNOSIS — C001 Malignant neoplasm of external lower lip: Secondary | ICD-10-CM | POA: Insufficient documentation

## 2013-04-27 DIAGNOSIS — I10 Essential (primary) hypertension: Secondary | ICD-10-CM | POA: Insufficient documentation

## 2013-04-27 DIAGNOSIS — Z87891 Personal history of nicotine dependence: Secondary | ICD-10-CM | POA: Insufficient documentation

## 2013-04-27 DIAGNOSIS — E785 Hyperlipidemia, unspecified: Secondary | ICD-10-CM | POA: Insufficient documentation

## 2013-04-27 DIAGNOSIS — Z7902 Long term (current) use of antithrombotics/antiplatelets: Secondary | ICD-10-CM | POA: Insufficient documentation

## 2013-04-27 HISTORY — PX: SHOULDER ARTHROSCOPY WITH ROTATOR CUFF REPAIR AND SUBACROMIAL DECOMPRESSION: SHX5686

## 2013-04-27 LAB — POCT HEMOGLOBIN-HEMACUE: Hemoglobin: 17.7 g/dL — ABNORMAL HIGH (ref 13.0–17.0)

## 2013-04-27 SURGERY — SHOULDER ARTHROSCOPY WITH ROTATOR CUFF REPAIR AND SUBACROMIAL DECOMPRESSION
Anesthesia: General | Site: Shoulder | Laterality: Right

## 2013-04-27 MED ORDER — FENTANYL CITRATE 0.05 MG/ML IJ SOLN
INTRAMUSCULAR | Status: AC
  Filled 2013-04-27: qty 2

## 2013-04-27 MED ORDER — DOCUSATE SODIUM 100 MG PO CAPS
100.0000 mg | ORAL_CAPSULE | Freq: Two times a day (BID) | ORAL | Status: DC
  Administered 2013-04-27 (×2): 100 mg via ORAL
  Filled 2013-04-27 (×2): qty 1

## 2013-04-27 MED ORDER — FENTANYL CITRATE 0.05 MG/ML IJ SOLN
INTRAMUSCULAR | Status: DC | PRN
  Administered 2013-04-27: 100 ug via INTRAVENOUS

## 2013-04-27 MED ORDER — HYDROMORPHONE HCL PF 1 MG/ML IJ SOLN
INTRAMUSCULAR | Status: AC
  Filled 2013-04-27: qty 1

## 2013-04-27 MED ORDER — OXYCODONE HCL 5 MG/5ML PO SOLN: 5.0000 mg | Freq: Once | ORAL | Status: AC | PRN

## 2013-04-27 MED ORDER — DEXTROSE 5 % IV SOLN: 500.0000 mg | Freq: Four times a day (QID) | INTRAVENOUS | Status: DC | PRN

## 2013-04-27 MED ORDER — EPINEPHRINE HCL 1 MG/ML IJ SOLN
INTRAMUSCULAR | Status: DC | PRN
  Administered 2013-04-27: .1 mL via SUBCUTANEOUS

## 2013-04-27 MED ORDER — HYDROMORPHONE HCL 2 MG PO TABS: ORAL_TABLET | ORAL | Status: DC

## 2013-04-27 MED ORDER — DEXAMETHASONE SODIUM PHOSPHATE 4 MG/ML IJ SOLN
INTRAMUSCULAR | Status: DC | PRN
  Administered 2013-04-27: 10 mg via INTRAVENOUS

## 2013-04-27 MED ORDER — PROPOFOL 10 MG/ML IV EMUL
INTRAVENOUS | Status: AC
  Filled 2013-04-27: qty 100

## 2013-04-27 MED ORDER — MIDAZOLAM HCL 2 MG/2ML IJ SOLN
1.0000 mg | INTRAMUSCULAR | Status: DC | PRN
  Administered 2013-04-27: 2 mg via INTRAVENOUS
  Filled 2013-04-27: qty 2

## 2013-04-27 MED ORDER — CEFAZOLIN SODIUM-DEXTROSE 2-3 GM-% IV SOLR
2.0000 g | Freq: Four times a day (QID) | INTRAVENOUS | Status: AC
  Administered 2013-04-27 – 2013-04-28 (×3): 2 g via INTRAVENOUS

## 2013-04-27 MED ORDER — CEFAZOLIN SODIUM-DEXTROSE 2-3 GM-% IV SOLR
INTRAVENOUS | Status: AC
  Filled 2013-04-27: qty 50

## 2013-04-27 MED ORDER — ONDANSETRON HCL 4 MG PO TABS: 4.0000 mg | ORAL_TABLET | Freq: Four times a day (QID) | ORAL | Status: DC | PRN

## 2013-04-27 MED ORDER — PANTOPRAZOLE SODIUM 40 MG PO TBEC
40.0000 mg | DELAYED_RELEASE_TABLET | Freq: Every day | ORAL | Status: DC
  Administered 2013-04-27: 40 mg via ORAL

## 2013-04-27 MED ORDER — FENTANYL CITRATE 0.05 MG/ML IJ SOLN
50.0000 ug | INTRAMUSCULAR | Status: DC | PRN
  Administered 2013-04-27: 100 ug via INTRAVENOUS

## 2013-04-27 MED ORDER — LIDOCAINE HCL (CARDIAC) 20 MG/ML IV SOLN
INTRAVENOUS | Status: DC | PRN
  Administered 2013-04-27: 100 mg via INTRAVENOUS

## 2013-04-27 MED ORDER — METOCLOPRAMIDE HCL 5 MG/ML IJ SOLN: 5.0000 mg | Freq: Three times a day (TID) | INTRAMUSCULAR | Status: DC | PRN

## 2013-04-27 MED ORDER — ONDANSETRON HCL 4 MG/2ML IJ SOLN: 4.0000 mg | Freq: Four times a day (QID) | INTRAMUSCULAR | Status: DC | PRN

## 2013-04-27 MED ORDER — FENTANYL CITRATE 0.05 MG/ML IJ SOLN
INTRAMUSCULAR | Status: AC
  Filled 2013-04-27: qty 4

## 2013-04-27 MED ORDER — AMLODIPINE-OLMESARTAN 5-40 MG PO TABS: 1.0000 | ORAL_TABLET | Freq: Every day | ORAL | Status: DC

## 2013-04-27 MED ORDER — CHLORHEXIDINE GLUCONATE 4 % EX LIQD: 60.0000 mL | Freq: Once | CUTANEOUS | Status: DC

## 2013-04-27 MED ORDER — DEXTROSE 5 % IV SOLN
3.0000 g | INTRAVENOUS | Status: DC | PRN
  Administered 2013-04-27: 3 g via INTRAVENOUS

## 2013-04-27 MED ORDER — NITROGLYCERIN 0.4 MG SL SUBL: 0.4000 mg | SUBLINGUAL_TABLET | SUBLINGUAL | Status: DC | PRN

## 2013-04-27 MED ORDER — OXYCODONE-ACETAMINOPHEN 5-325 MG PO TABS
1.0000 | ORAL_TABLET | ORAL | Status: DC | PRN
  Administered 2013-04-27 – 2013-04-28 (×5): 2 via ORAL
  Filled 2013-04-27 (×6): qty 2

## 2013-04-27 MED ORDER — ONDANSETRON HCL 4 MG/2ML IJ SOLN: 4.0000 mg | Freq: Once | INTRAMUSCULAR | Status: AC | PRN

## 2013-04-27 MED ORDER — LIDOCAINE HCL 4 % MT SOLN
OROMUCOSAL | Status: DC | PRN
  Administered 2013-04-27: 3 mL via TOPICAL

## 2013-04-27 MED ORDER — OXYCODONE HCL 5 MG PO TABS: 5.0000 mg | ORAL_TABLET | Freq: Once | ORAL | Status: AC | PRN

## 2013-04-27 MED ORDER — HYDROMORPHONE HCL PF 1 MG/ML IJ SOLN
0.5000 mg | INTRAMUSCULAR | Status: DC | PRN
  Administered 2013-04-27 – 2013-04-28 (×8): 1 mg via INTRAVENOUS
  Filled 2013-04-27 (×8): qty 1

## 2013-04-27 MED ORDER — ALBUTEROL SULFATE HFA 108 (90 BASE) MCG/ACT IN AERS: 2.0000 | INHALATION_SPRAY | Freq: Four times a day (QID) | RESPIRATORY_TRACT | Status: DC | PRN

## 2013-04-27 MED ORDER — MIDAZOLAM HCL 2 MG/2ML IJ SOLN
INTRAMUSCULAR | Status: AC
  Filled 2013-04-27: qty 2

## 2013-04-27 MED ORDER — CEFAZOLIN SODIUM 1-5 GM-% IV SOLN: 1.0000 g | Freq: Once | INTRAVENOUS | Status: DC

## 2013-04-27 MED ORDER — CEPHALEXIN 500 MG PO CAPS: 500.0000 mg | ORAL_CAPSULE | Freq: Three times a day (TID) | ORAL | Status: DC

## 2013-04-27 MED ORDER — HYDROMORPHONE HCL PF 1 MG/ML IJ SOLN
0.2500 mg | INTRAMUSCULAR | Status: DC | PRN
  Administered 2013-04-27 (×4): 0.5 mg via INTRAVENOUS

## 2013-04-27 MED ORDER — CEFAZOLIN SODIUM-DEXTROSE 2-3 GM-% IV SOLR: 2.0000 g | Freq: Once | INTRAVENOUS | Status: DC

## 2013-04-27 MED ORDER — ONDANSETRON HCL 4 MG/2ML IJ SOLN
INTRAMUSCULAR | Status: DC | PRN
  Administered 2013-04-27: 4 mg via INTRAVENOUS

## 2013-04-27 MED ORDER — EPHEDRINE SULFATE 50 MG/ML IJ SOLN
INTRAMUSCULAR | Status: DC | PRN
  Administered 2013-04-27 (×4): 10 mg via INTRAVENOUS

## 2013-04-27 MED ORDER — PROPOFOL 10 MG/ML IV BOLUS
INTRAVENOUS | Status: DC | PRN
  Administered 2013-04-27: 250 mg via INTRAVENOUS

## 2013-04-27 MED ORDER — HYDROMORPHONE HCL PF 1 MG/ML IJ SOLN
0.5000 mg | INTRAMUSCULAR | Status: AC | PRN
  Administered 2013-04-27 (×4): 0.5 mg via INTRAVENOUS

## 2013-04-27 MED ORDER — DEXTROSE-NACL 5-0.45 % IV SOLN
INTRAVENOUS | Status: DC
Start: 2013-04-27 — End: 2013-04-28
  Administered 2013-04-27 – 2013-04-28 (×2): via INTRAVENOUS

## 2013-04-27 MED ORDER — ROPIVACAINE HCL 5 MG/ML IJ SOLN
INTRAMUSCULAR | Status: DC | PRN
  Administered 2013-04-27: 25 mL via PERINEURAL

## 2013-04-27 MED ORDER — PHENYLEPHRINE HCL 10 MG/ML IJ SOLN
10.0000 mg | INTRAVENOUS | Status: DC | PRN
  Administered 2013-04-27: 50 ug/min via INTRAVENOUS

## 2013-04-27 MED ORDER — MONTELUKAST SODIUM 10 MG PO TABS: 10.0000 mg | ORAL_TABLET | ORAL | Status: DC | PRN

## 2013-04-27 MED ORDER — METOCLOPRAMIDE HCL 5 MG PO TABS: 5.0000 mg | ORAL_TABLET | Freq: Three times a day (TID) | ORAL | Status: DC | PRN

## 2013-04-27 MED ORDER — LACTATED RINGERS IV SOLN
INTRAVENOUS | Status: DC
  Administered 2013-04-27 (×2): via INTRAVENOUS

## 2013-04-27 MED ORDER — HYDROCODONE-ACETAMINOPHEN 5-325 MG PO TABS: 1.0000 | ORAL_TABLET | ORAL | Status: DC | PRN

## 2013-04-27 MED ORDER — PHENYLEPHRINE HCL 10 MG/ML IJ SOLN
INTRAMUSCULAR | Status: DC | PRN
  Administered 2013-04-27: 80 ug via INTRAVENOUS

## 2013-04-27 MED ORDER — MIDAZOLAM HCL 2 MG/2ML IJ SOLN
1.0000 mg | Freq: Once | INTRAMUSCULAR | Status: AC
  Administered 2013-04-27: 1 mg via INTRAVENOUS

## 2013-04-27 MED ORDER — METHOCARBAMOL 500 MG PO TABS
500.0000 mg | ORAL_TABLET | Freq: Four times a day (QID) | ORAL | Status: DC | PRN
  Administered 2013-04-27 – 2013-04-28 (×3): 500 mg via ORAL
  Filled 2013-04-27 (×4): qty 1

## 2013-04-27 MED ORDER — DULOXETINE HCL 60 MG PO CPEP
90.0000 mg | ORAL_CAPSULE | Freq: Every day | ORAL | Status: DC
  Administered 2013-04-27: 90 mg via ORAL

## 2013-04-27 MED ORDER — SUCCINYLCHOLINE CHLORIDE 20 MG/ML IJ SOLN
INTRAMUSCULAR | Status: DC | PRN
  Administered 2013-04-27: 120 mg via INTRAVENOUS

## 2013-04-27 SURGICAL SUPPLY — 82 items
ANCH SUT SWLK 19.1 CLS EYLT TL (Anchor) ×1 IMPLANT
ANCH SUT SWLK 19.1 CLS EYLT VT (Anchor) ×1 IMPLANT
ANCH SUT SWLK 19.1X4.75 (Anchor) ×2 IMPLANT
ANCH SUT SWLK 19.1X5.5 CLS EL (Anchor) ×2 IMPLANT
ANCHOR BIO SWLOCK 4.75 W/TIG (Anchor) ×2 IMPLANT
ANCHOR PEEK SWIVEL LOCK 5.5 (Anchor) ×4 IMPLANT
ANCHOR SUT BIO SW 4.75 W/FIB (Anchor) ×2 IMPLANT
ANCHOR SUT BIO SW 4.75X19.1 (Anchor) ×4 IMPLANT
BANDAGE ADH SHEER 1  50/CT (GAUZE/BANDAGES/DRESSINGS) IMPLANT
BLADE AVERAGE 25MMX9MM (BLADE)
BLADE AVERAGE 25X9 (BLADE) IMPLANT
BLADE CUTTER MENIS 5.5 (BLADE) IMPLANT
BLADE SURG 15 STRL LF DISP TIS (BLADE) ×2 IMPLANT
BLADE SURG 15 STRL SS (BLADE) ×6
BUR EGG 3PK/BX (BURR) IMPLANT
BUR OVAL 6.0 (BURR) ×3 IMPLANT
CANISTER SUCT 3000ML (MISCELLANEOUS) IMPLANT
CANNULA TWIST IN 8.25X7CM (CANNULA) IMPLANT
CLEANER CAUTERY TIP 5X5 PAD (MISCELLANEOUS) IMPLANT
CLOSURE WOUND 1/2 X4 (GAUZE/BANDAGES/DRESSINGS)
CUTTER MENISCUS  4.2MM (BLADE) ×2
CUTTER MENISCUS 4.2MM (BLADE) ×1 IMPLANT
DECANTER SPIKE VIAL GLASS SM (MISCELLANEOUS) IMPLANT
DRAPE INCISE IOBAN 66X45 STRL (DRAPES) ×3 IMPLANT
DRAPE STERI 35X30 U-POUCH (DRAPES) ×3 IMPLANT
DRAPE SURG 17X23 STRL (DRAPES) ×3 IMPLANT
DRAPE U-SHAPE 47X51 STRL (DRAPES) ×3 IMPLANT
DRAPE U-SHAPE 76X120 STRL (DRAPES) ×6 IMPLANT
DURAPREP 26ML APPLICATOR (WOUND CARE) ×3 IMPLANT
ELECT REM PT RETURN 9FT ADLT (ELECTROSURGICAL)
ELECTRODE REM PT RTRN 9FT ADLT (ELECTROSURGICAL) IMPLANT
GLOVE BIOGEL M STRL SZ7.5 (GLOVE) ×3 IMPLANT
GLOVE BIOGEL PI IND STRL 8 (GLOVE) ×2 IMPLANT
GLOVE BIOGEL PI INDICATOR 8 (GLOVE) ×4
GLOVE ORTHO TXT STRL SZ7.5 (GLOVE) ×3 IMPLANT
GOWN STRL REIN 2XL XLG LVL4 (GOWN DISPOSABLE) ×3 IMPLANT
GOWN STRL REUS W/TWL XL LVL4 (GOWN DISPOSABLE) ×6 IMPLANT
MANIFOLD NEPTUNE II (INSTRUMENTS) ×3 IMPLANT
NDL SCORPION (NEEDLE) ×1 IMPLANT
NDL SUT 6 .5 CRC .975X.05 MAYO (NEEDLE) IMPLANT
NEEDLE MAYO TAPER (NEEDLE)
NEEDLE MINI RC 24MM (NEEDLE) IMPLANT
NEEDLE SCORPION (NEEDLE) ×3 IMPLANT
PACK ARTHROSCOPY DSU (CUSTOM PROCEDURE TRAY) ×3 IMPLANT
PACK BASIN DAY SURGERY FS (CUSTOM PROCEDURE TRAY) ×3 IMPLANT
PAD ABD 8X10 STRL (GAUZE/BANDAGES/DRESSINGS) ×3 IMPLANT
PAD CLEANER CAUTERY TIP 5X5 (MISCELLANEOUS)
PASSER SUT SWANSON 36MM LOOP (INSTRUMENTS) IMPLANT
PENCIL BUTTON HOLSTER BLD 10FT (ELECTRODE) IMPLANT
SLEEVE SCD COMPRESS KNEE MED (MISCELLANEOUS) ×3 IMPLANT
SLING ARM LRG ADULT FOAM STRAP (SOFTGOODS) IMPLANT
SLING ARM MED ADULT FOAM STRAP (SOFTGOODS) IMPLANT
SPONGE GAUZE 4X4 12PLY (GAUZE/BANDAGES/DRESSINGS) ×3 IMPLANT
SPONGE LAP 4X18 X RAY DECT (DISPOSABLE) IMPLANT
STRIP CLOSURE SKIN 1/2X4 (GAUZE/BANDAGES/DRESSINGS) IMPLANT
SUCTION FRAZIER TIP 10 FR DISP (SUCTIONS) IMPLANT
SUT FIBERWIRE #2 38 T-5 BLUE (SUTURE)
SUT FIBERWIRE 3-0 18 TAPR NDL (SUTURE)
SUT PROLENE 1 CT (SUTURE) IMPLANT
SUT PROLENE 3 0 PS 2 (SUTURE) ×3 IMPLANT
SUT TIGER TAPE 7 IN WHITE (SUTURE) IMPLANT
SUT VIC AB 0 CT1 27 (SUTURE)
SUT VIC AB 0 CT1 27XBRD ANBCTR (SUTURE) IMPLANT
SUT VIC AB 0 SH 27 (SUTURE) IMPLANT
SUT VIC AB 2-0 SH 27 (SUTURE)
SUT VIC AB 2-0 SH 27XBRD (SUTURE) IMPLANT
SUT VIC AB 3-0 SH 27 (SUTURE)
SUT VIC AB 3-0 SH 27X BRD (SUTURE) IMPLANT
SUT VIC AB 3-0 X1 27 (SUTURE) IMPLANT
SUTURE FIBERWR #2 38 T-5 BLUE (SUTURE) IMPLANT
SUTURE FIBERWR 3-0 18 TAPR NDL (SUTURE) IMPLANT
SYR 3ML 23GX1 SAFETY (SYRINGE) IMPLANT
SYR BULB 3OZ (MISCELLANEOUS) IMPLANT
TAPE FIBER 2MM 7IN #2 BLUE (SUTURE) IMPLANT
TAPE PAPER 3X10 WHT MICROPORE (GAUZE/BANDAGES/DRESSINGS) ×3 IMPLANT
TOWEL OR 17X24 6PK STRL BLUE (TOWEL DISPOSABLE) ×3 IMPLANT
TUBE CONNECTING 20'X1/4 (TUBING) ×1
TUBE CONNECTING 20X1/4 (TUBING) ×2 IMPLANT
TUBING ARTHROSCOPY IRRIG 16FT (MISCELLANEOUS) ×3 IMPLANT
WAND STAR VAC 90 (SURGICAL WAND) ×3 IMPLANT
WATER STERILE IRR 1000ML POUR (IV SOLUTION) ×3 IMPLANT
YANKAUER SUCT BULB TIP NO VENT (SUCTIONS) IMPLANT

## 2013-04-27 NOTE — Anesthesia Preprocedure Evaluation (Addendum)
Anesthesia Evaluation  Patient identified by MRN, date of birth, ID band Patient awake    Reviewed: Allergy & Precautions, H&P , NPO status , Patient's Chart, lab work & pertinent test results  Airway       Dental   Pulmonary sleep apnea and Continuous Positive Airway Pressure Ventilation , former smoker,          Cardiovascular hypertension, Pt. on medications + CAD     Neuro/Psych    GI/Hepatic GERD-  Medicated and Controlled,  Endo/Other  Morbid obesity  Renal/GU      Musculoskeletal   Abdominal   Peds  Hematology   Anesthesia Other Findings   Reproductive/Obstetrics                          Anesthesia Physical Anesthesia Plan  ASA: III  Anesthesia Plan: General   Post-op Pain Management:    Induction: Intravenous  Airway Management Planned: Oral ETT  Additional Equipment:   Intra-op Plan:   Post-operative Plan: Extubation in OR  Informed Consent: I have reviewed the patients History and Physical, chart, labs and discussed the procedure including the risks, benefits and alternatives for the proposed anesthesia with the patient or authorized representative who has indicated his/her understanding and acceptance.   Dental advisory given  Plan Discussed with: CRNA, Anesthesiologist and Surgeon  Anesthesia Plan Comments:         Anesthesia Quick Evaluation

## 2013-04-27 NOTE — Transfer of Care (Signed)
Immediate Anesthesia Transfer of Care Note  Patient: Jacob Romero  Procedure(s) Performed: Procedure(s): RIGHT SHOULDER ARTHROSCOPY WITH DEBRIDEMENT WITH OPEN RECONSTRUCTION ROTATOR CUFF (Right)  Patient Location: PACU  Anesthesia Type:GA combined with regional for post-op pain  Level of Consciousness: awake, alert  and oriented  Airway & Oxygen Therapy: Patient Spontanous Breathing and Patient connected to face mask oxygen  Post-op Assessment: Report given to PACU RN and Post -op Vital signs reviewed and stable  Post vital signs: Reviewed and stable  Complications: No apparent anesthesia complications

## 2013-04-27 NOTE — Anesthesia Postprocedure Evaluation (Signed)
  Anesthesia Post-op Note  Patient: Jacob Romero  Procedure(s) Performed: Procedure(s): RIGHT SHOULDER ARTHROSCOPY WITH DEBRIDEMENT WITH OPEN RECONSTRUCTION ROTATOR CUFF (Right)  Patient Location: PACU  Anesthesia Type:GA combined with regional for post-op pain  Level of Consciousness: awake and alert   Airway and Oxygen Therapy: Patient Spontanous Breathing and Patient connected to face mask oxygen  Post-op Pain: moderate  Post-op Assessment: Post-op Vital signs reviewed  Post-op Vital Signs: Reviewed  Complications: No apparent anesthesia complications

## 2013-04-27 NOTE — Progress Notes (Signed)
Assisted Dr. Crews with right, ultrasound guided, interscalene  block. Side rails up, monitors on throughout procedure. See vital signs in flow sheet. Tolerated Procedure well. 

## 2013-04-27 NOTE — Op Note (Signed)
[id number] 

## 2013-04-27 NOTE — Brief Op Note (Addendum)
04/27/2013  10:44 AM  PATIENT:  Jacob Romero  63 y.o. male  PRE-OPERATIVE DIAGNOSIS:  FULL THICKNESS RETRACTED ROTATOR CUFF TEAR RIGHT   POST-OPERATIVE DIAGNOSIS:  full thickness retracted rotator cuff tear right shoulder  PROCEDURE:  Procedure(s): RIGHT SHOULDER ARTHROSCOPY WITH DEBRIDEMENT WITH OPEN RECONSTRUCTION ROTATOR CUFF (Right)  SURGEON:  Surgeon(s) and Role:    * Cammie Sickle., MD - Primary  PHYSICIAN ASSISTANT:   ASSISTANTS: Kathyrn Sheriff.A-C   ANESTHESIA:   general  EBL:  Total I/O In: 1000 [I.V.:1000] Out: -   BLOOD ADMINISTERED:none  DRAINS: none   LOCAL MEDICATIONS USED: ropivacaine plexus block  SPECIMEN:  No Specimen  DISPOSITION OF SPECIMEN:  N/A  COUNTS:  YES  TOURNIQUET:  * No tourniquets in log *  DICTATION: [id number]  PLAN OF CARE: Admit for overnight observation  PATIENT DISPOSITION:  PACU - hemodynamically stable.   Delay start of Pharmacological VTE agent (>24hrs) due to surgical blood loss or risk of bleeding: not applicable

## 2013-04-27 NOTE — Anesthesia Procedure Notes (Addendum)
Anesthesia Regional Block:  Interscalene brachial plexus block  Pre-Anesthetic Checklist: ,, timeout performed, Correct Patient, Correct Site, Correct Laterality, Correct Procedure, Correct Position, site marked, Risks and benefits discussed,  Surgical consent,  Pre-op evaluation,  At surgeon's request and post-op pain management  Laterality: Right and Upper  Prep: chloraprep       Needles:  Injection technique: Single-shot  Needle Type: Echogenic Needle     Needle Length: 5cm 5 cm Needle Gauge: 21 and 21 G    Additional Needles:  Procedures: ultrasound guided (picture in chart) Interscalene brachial plexus block Narrative:  Start time: 04/27/2013 7:50 AM End time: 04/27/2013 8:03 AM Injection made incrementally with aspirations every 5 mL.  Performed by: Personally  Anesthesiologist: Lorrene Reid, MD   Procedure Name: Intubation Date/Time: 04/27/2013 9:25 AM Performed by: Maryella Shivers Pre-anesthesia Checklist: Patient identified, Emergency Drugs available, Suction available and Patient being monitored Patient Re-evaluated:Patient Re-evaluated prior to inductionOxygen Delivery Method: Circle System Utilized Preoxygenation: Pre-oxygenation with 100% oxygen Intubation Type: IV induction Ventilation: Two handed mask ventilation required Laryngoscope Size: Mac and 3 Grade View: Grade II Tube type: Oral Tube size: 8.0 mm Number of attempts: 1 Airway Equipment and Method: stylet and oral airway Placement Confirmation: ETT inserted through vocal cords under direct vision,  positive ETCO2 and breath sounds checked- equal and bilateral Secured at: 22 cm Tube secured with: Tape Dental Injury: Teeth and Oropharynx as per pre-operative assessment

## 2013-04-28 NOTE — Op Note (Signed)
NAME:  PRITHVI, KOOI NO.:  [id number]  MEDICAL RECORD NO.:  [id number]  LOCATION:                                 FACILITY:  PHYSICIAN:  Youlanda Mighty. Dajanique Robley, M.D. DATE OF BIRTH:  December 20, 1950  DATE OF PROCEDURE:  04/27/2013 DATE OF DISCHARGE:                              OPERATIVE REPORT   PREOPERATIVE DIAGNOSES:  MRI documented massive acute on chronic avulsion of right rotator cuff involving supraspinatus, infraspinatus, and teres minor with significant synovitis.  POSTOPERATIVE DIAGNOSES:  MRI documented massive acute on chronic avulsion of right rotator cuff involving supraspinatus, infraspinatus, and teres minor with significant synovitis with degenerative labral tear, type 1, and considerable synovitis with markedly retracted infraspinatus, supraspinatus, and teres minor rotator cuff tear.  OPERATION: 1. Diagnostic arthroscopy, right glenohumeral joint. 2. Arthroscopic debridement of labrum and synovitis. 3. Open reconstruction of right rotator cuff utilizing 2 Arthrex     swivel locks medially. 4. Placement of 4 lateral swivel locks with a complex double diamondback repair      With neutralization sutures for the infraspinatus and supraspinatus.  OPERATING SURGEON:  Youlanda Mighty. Graclyn Lawther, M.D.  ASSIST:  Marily Lente. Dasnoit, PA-C.  ANESTHESIA:  General endotracheal supplemented by a right interscalene block.  SUPERVISING ANESTHESIOLOGIST:  Lorrene Reid, M.D.  INDICATIONS:  Jacob Romero is a 63 year old gentleman, who was referred through courtesy of Dr. Burney Gauze for evaluation and management of an acute injury to the right shoulder.  Mr. Mijangos has multiple complex medical problems.  He is on Plavix anticoagulation due to chronic coronary artery disease.  He is under the care of [name], but is stable at this time.  He recently sustained a very significant injury to the right shoulder and presented for evaluation by Dr. Burney Gauze who noted  marked weakness of scaption, flexion, and abduction.  He is referred for an upper extremity orthopedic consult regarding shoulder care.  Clinical examination revealed marked weakness of flexion, abduction and scaption, and extension.  He was sent for an MRI that documented a very significant avulsion of the supraspinatus, infraspinatus, and teres minor.  Fortunately, he did not have atrophy of the muscles.  His infraspinatus and teres minor were piled up posteriorly behind the humeral head.  We made arrangements for acute care.  We contacted Dr. Kennon Holter office and requested review of his chart.  We were advised by Dr. Kennon Holter colleague 7 days prior to this surgery that it would be safe to stop his Plavix.  His Plavix was discontinued on April 18, 2013.  Dr. Gwenlyn Found was kind enough to send a letter advising that it was safe to proceed with surgery without an acute cardiac evaluation.  Mr. Rallo is brought to the operating room at this time.  In the holding area, he was interviewed by Dr. Al Corpus.  Dr. Al Corpus outlined anesthesia choices and recommended an interscalene block placed with ultrasound guidance, and general endotracheal anesthesia.  Mr. Limehouse accepted Dr. Al Corpus recommendations and in the holding area under sedation Dr. Al Corpus placed a interscalene block without complication.  This led to satisfactory anesthesia of the right arm and forequarter.  Mr. Jost right arm was marked as the proper  surgical site per protocol with the marking pen.  DESCRIPTION OF PROCEDURE:  Eddy Liszewski was transferred to room 1 of the [facility] and placed in supine position upon the operating table.  Following the induction of general endotracheal anesthesia under Dr. Al Corpus' direct supervision, he was carefully positioned in the beach chair position with aid of a torso and head holder designed for shoulder arthroscopy.  All bony prominences were padded.  Passive compression devices  were applied to his calves.  All bony prominences were padded. His ulnar nerves were protected and great care was taken to avoid any tight bands or clothing around his midsection.  The entire right upper extremity and forequarter were prepped with DuraPrep and draped with impervious arthroscopy drapes.  A 3 g of Ancef were administered as an IV prophylactic antibiotic.  Following a routine surgical time-out, the shoulder was instrumented with a switching stick anteriorly and the scope being placed posterior through a standard posterior viewing portal.  Diagnostic arthroscopy revealed a massive avulsion of the entire cuff except the subscapularis.  The long head of the biceps was somewhat obscured by debris.  There was a very significant injury and tear of the labrum. The hyaline articular cartilage surfaces of the humeral head and glenoid were both in good condition.  An anterior port was created under direct vision followed by use of a suction shaver to debride the labrum to a stable margin.  Synovitis was debrided with a suction shaver followed by careful documentation of the cuff pathology.  The scope was then removed, and due to preoperative assessment of the [facility] joint and acromion being acceptable, we proceeded directly to a muscle- splitting incision between the anterior/  middle thirds of the deltoid exposing the massive cuff tear.  There was hypertrophic bursa present.  A radical bursectomy was accomplished followed by excision of the bursal leader from the tear.  The supraspinatus was recovered with the [facility] followed by placement of a reverse mattress fiber tape for traction.  The infraspinatus and teres minor were recovered posteriorly followed by extensive bursectomy.  We placed 2 medial swivel locks one at the central aspect of the supraspinatus, the second at the junction between the infraspinatus and teres minor. We then decorticated the greater tuberosity from  the biceps tendon posteriorly to the insertion of the teres minor.  We then performed a complex double diamondback paired repair of the tendons to an anatomic footprint.  The fiber wire retrieval sutures of 2 of the lateral swivel locks were used to perform a finishing suture of the infraspinatus and teres minor.  The bursa was palpated and irrigated thoroughly with sterile saline. The anatomy of the coracoacromial arch was satisfactory.  After hemostasis was achieved, the deltoid split was repaired with figure-of- eight suture of 0 Vicryl followed by repair of the skin with subcutaneous 2-0 Vicryl and intradermal 3-0 Prolene.  There were no apparent complications.  Mr. Lanigan is placed in a sling, awakened from general anesthesia and transferred to the recovery room with stable vital signs.  He will be admitted for 24 hours observation.  He will be placed on oxygen by mask to maintain O2 sats.  We will resume his Plavix in 24 hours.  We will monitor his vital signs including possible use of his CPAP machine for sleep apnea.     Youlanda Mighty Neko Boyajian, M.D.     RVS/MEDQ  D:  04/27/2013  T:  04/28/2013  Job:  [id number]  cc:  Quay Burow, M.D.

## 2013-04-30 ENCOUNTER — Encounter (HOSPITAL_BASED_OUTPATIENT_CLINIC_OR_DEPARTMENT_OTHER): Payer: Self-pay | Admitting: Orthopedic Surgery

## 2013-05-01 ENCOUNTER — Encounter (HOSPITAL_BASED_OUTPATIENT_CLINIC_OR_DEPARTMENT_OTHER): Payer: Self-pay | Admitting: Orthopedic Surgery

## 2013-05-09 ENCOUNTER — Encounter: Payer: Self-pay | Admitting: Cardiovascular Disease

## 2013-05-10 NOTE — Telephone Encounter (Signed)
Message forwarded to Kathryn, RN.  

## 2013-05-16 ENCOUNTER — Encounter: Payer: Self-pay | Admitting: *Deleted

## 2013-09-25 ENCOUNTER — Other Ambulatory Visit: Payer: Self-pay | Admitting: *Deleted

## 2013-09-25 ENCOUNTER — Encounter: Payer: Self-pay | Admitting: *Deleted

## 2013-09-25 MED ORDER — FUROSEMIDE 40 MG PO TABS: 40.0000 mg | ORAL_TABLET | Freq: Every day | ORAL | Status: DC

## 2013-09-25 NOTE — Telephone Encounter (Signed)
Rx was sent to pharmacy electronically. Confirmed with patient via MyChart message med and dose (as not on previous med list)

## 2013-09-26 ENCOUNTER — Other Ambulatory Visit: Payer: Self-pay | Admitting: *Deleted

## 2013-12-19 ENCOUNTER — Other Ambulatory Visit: Payer: Self-pay

## 2013-12-19 MED ORDER — CLOPIDOGREL BISULFATE 75 MG PO TABS: 75.0000 mg | ORAL_TABLET | Freq: Every day | ORAL | Status: DC

## 2013-12-19 NOTE — Telephone Encounter (Signed)
Rx sent to pharmacy   

## 2014-03-07 ENCOUNTER — Encounter (HOSPITAL_COMMUNITY): Payer: Self-pay | Admitting: Cardiovascular Disease

## 2014-03-25 ENCOUNTER — Other Ambulatory Visit: Payer: Self-pay

## 2014-03-25 MED ORDER — PANTOPRAZOLE SODIUM 40 MG PO TBEC: 40.0000 mg | DELAYED_RELEASE_TABLET | Freq: Every day | ORAL | Status: DC

## 2014-03-25 NOTE — Telephone Encounter (Signed)
Rx sent to pharmacy   

## 2014-04-23 ENCOUNTER — Other Ambulatory Visit: Payer: Self-pay | Admitting: *Deleted

## 2014-04-25 ENCOUNTER — Other Ambulatory Visit: Payer: Self-pay

## 2014-04-25 MED ORDER — PANTOPRAZOLE SODIUM 40 MG PO TBEC: 40.0000 mg | DELAYED_RELEASE_TABLET | Freq: Every day | ORAL | Status: DC

## 2014-04-25 NOTE — Telephone Encounter (Signed)
Rx sent to pharmacy   

## 2014-05-27 ENCOUNTER — Other Ambulatory Visit: Payer: Self-pay | Admitting: Cardiovascular Disease

## 2014-05-27 NOTE — Telephone Encounter (Signed)
Rx has been sent to the pharmacy electronically. ° °

## 2014-07-02 ENCOUNTER — Ambulatory Visit: Payer: Self-pay | Admitting: Cardiovascular Disease

## 2014-08-21 ENCOUNTER — Encounter: Payer: Self-pay | Admitting: Cardiovascular Disease

## 2014-08-21 ENCOUNTER — Ambulatory Visit (INDEPENDENT_AMBULATORY_CARE_PROVIDER_SITE_OTHER): Payer: 59 | Admitting: Cardiovascular Disease

## 2014-08-21 VITALS — BP 122/78 | HR 73 | Ht 68.5 in | Wt 265.0 lb

## 2014-08-21 DIAGNOSIS — I441 Atrioventricular block, second degree: Secondary | ICD-10-CM | POA: Diagnosis not present

## 2014-08-21 DIAGNOSIS — G4730 Sleep apnea, unspecified: Secondary | ICD-10-CM

## 2014-08-21 DIAGNOSIS — I2510 Atherosclerotic heart disease of native coronary artery without angina pectoris: Secondary | ICD-10-CM

## 2014-08-21 DIAGNOSIS — I10 Essential (primary) hypertension: Secondary | ICD-10-CM | POA: Diagnosis not present

## 2014-08-21 DIAGNOSIS — E785 Hyperlipidemia, unspecified: Secondary | ICD-10-CM

## 2014-08-21 MED ORDER — PANTOPRAZOLE SODIUM 40 MG PO TBEC: 40.0000 mg | DELAYED_RELEASE_TABLET | Freq: Every day | ORAL | Status: DC

## 2014-08-21 NOTE — Assessment & Plan Note (Signed)
History of hypertension blood pressure measured at 122/78. He is on Benicar, hydrochlorothiazide. Continue current meds at current dosing

## 2014-08-21 NOTE — Assessment & Plan Note (Signed)
History of hyperlipidemia on Crestor 5 mg a day with recent lipid profile performed by his primary care physician 03/07/14 revealed a total cholesterol 169, LDL 87 and HDL of 43.

## 2014-08-21 NOTE — Progress Notes (Signed)
08/21/2014 Jacob Romero   1950-12-06  [id number]  Primary Physician Quintella Reichert, MD Primary Cardiologist: Lorretta Harp MD [name]   HPI: The patient is a very pleasant 64 year old severely overweight married Caucasian male father of 2, grandfather of 2 grandchildren who I saw 04/03/13.[name] He has a history remarkable for hypertension, hyperlipidemia and known CAD. He does have sleep apnea on CPAP. He had above-to-below the knee popliteal bypass grafting by Dr. [name] after an occluded distal right SFA stent which he is now asymptomatic from and which we get annual Dopplers. I cathed him May 15, 2009 via the right radial approach revealing a high-grade mid RCA stenosis with a totally occluded PLA. There was an anterior takeoff. I attempted recanalization, however, he developed a subarachnoid hemorrhage and the procedure was aborted. Dr. Claiborne Billings brought him back and performed high-speed rotational atherectomy, PCI and stenting of his mid RCA but was unable to recanalize the PLA branch. He is completely asymptomatic with regards to this. He has got an excellent lipid profile for secondary prevention, followed closely by Dr. Micheal Likens. His total cholesterol is 140, LDL of 74 and HDL of 45.  I saw him back in the office 05/05/12 at which time he was currently stable. For the last several months he developed progressive dyspnea on exertion fatigue and lower extreme edema. The dyspnea was similar to his symptoms prior to intervention in the past.I obtained a 2-D echo which showed mild to moderate LV dysfunction with an EF in the 45% range and inferior hypokinesia. A Myoview stress test likewise showed LV dysfunction. Because of this I performed cardiac catheterization on him 11/21/12 revealing a patent mid RCA stent with an occluded posterolateral branch which is chronic. His EF was 60%. He continues to be symptomatic. The monitor showed sinus rhythm, sinus tach and second degree AV  block. I referred him to Dr. Thompson Grayer who adjusted his medications. He discontinued his beta blocker and his Mobic. He performed a cardiopulmonary exercise testing which suggested that his symptoms were related to deconditioning and obesity. The patient was clinically improved. I suggested that he seek the potential to dietitian for weight loss.since I saw him last January 2015 he has lost 25 pounds as a result of diet and exercise. He has clinically improved. He denies chest pain or shortness of breath. He is planning on retiring in September of this year moving to [place] near [place].   Current Outpatient Prescriptions  Medication Sig Dispense Refill  . acetaminophen (TYLENOL) 650 MG CR tablet Take 650 mg by mouth every 8 (eight) hours as needed for pain.    [name] ADVAIR DISKUS 250-50 MCG/DOSE AEPB Inhale 1 puff into the lungs 2 (two) times daily.    [name] albuterol (PROVENTIL HFA;VENTOLIN HFA) 108 (90 BASE) MCG/ACT inhaler Inhale 2 puffs into the lungs every 6 (six) hours as needed for wheezing or shortness of breath.     [name] amLODipine (NORVASC) 5 MG tablet Take 5 mg by mouth daily.     [name] aspirin EC 81 MG tablet Take 81 mg by mouth daily.      . Aspirin-Acetaminophen-Caffeine (EXCEDRIN MIGRAINE PO) Take 1 tablet by mouth daily as needed (migraine).     . Azelastine-Fluticasone (DYMISTA) 137-50 MCG/ACT SUSP Place 1 spray into the nose daily as needed (seasonal allergies).     Cephus Richer HCT 40-12.5 MG per tablet Take 1 tablet by mouth daily.     . cetirizine (ZYRTEC) 10  MG tablet Take 10 mg by mouth daily.    . clopidogrel (PLAVIX) 75 MG tablet TAKE 1 TABLET BY MOUTH DAILY 30 tablet 2  . DULoxetine (CYMBALTA) 30 MG capsule Take 90 mg by mouth daily. Take 3 tablets daily.    . Fiber CAPS Take 16 capsules by mouth daily.     . fish oil-omega-3 fatty acids 1000 MG capsule Take 3 g by mouth daily.     . furosemide (LASIX) 40 MG tablet Take 1 tablet (40 mg total) by mouth daily. 30 tablet 6    . gabapentin (NEURONTIN) 400 MG capsule Take 400 mg by mouth as needed.    [name] ketoconazole (NIZORAL) 2 % cream     . montelukast (SINGULAIR) 10 MG tablet Take 10 mg by mouth as needed.     . Multiple Vitamin (MULTIVITAMIN WITH MINERALS) TABS tablet Take 1 tablet by mouth daily.    . nitroGLYCERIN (NITROSTAT) 0.4 MG SL tablet Place 0.4 mg under the tongue every 5 (five) minutes as needed for chest pain.    . pantoprazole (PROTONIX) 40 MG tablet Take 1 tablet (40 mg total) by mouth daily. 30 tablet 11  . rosuvastatin (CRESTOR) 5 MG tablet Take 5 mg by mouth 2 (two) times a week. 2 times weekly.    . TESTIM 50 MG/5GM GEL      No current facility-administered medications for this visit.    Allergies  Allergen Reactions  . Statins     Unable to tolerate high dose statins    History   Social History  . Marital Status: Married    Spouse Name: N/A  . Number of Children: N/A  . Years of Education: N/A   Occupational History  . Not on file.   Social History Main Topics  . Smoking status: Former Smoker    Types: Cigarettes    Quit date: 06/01/1991  . Smokeless tobacco: Not on file  . Alcohol Use: No  . Drug Use: No  . Sexual Activity: Not on file   Other Topics Concern  . 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  Cardiovascular: negative for chest pain, dyspnea on exertion, edema, orthopnea, palpitations, paroxysmal nocturnal dyspnea or shortness of breath Dermatological: negative for rash Respiratory: negative for cough or wheezing Urologic: negative for hematuria Abdominal: negative for nausea, vomiting, diarrhea, bright red blood per rectum, melena, or hematemesis Neurologic: negative for visual changes, syncope, or dizziness All other systems reviewed and are otherwise negative except as noted above.    Blood pressure 122/78, pulse 73,  height 5' 8.5" (1.74 m), weight 265 lb (120.203 kg).  General appearance: alert and no distress Neck: no adenopathy, no carotid bruit, no JVD, supple, symmetrical, trachea midline and thyroid not enlarged, symmetric, no tenderness/mass/nodules Lungs: clear to auscultation bilaterally Heart: regular rate and rhythm, S1, S2 normal, no murmur, click, rub or gallop Extremities: extremities normal, atraumatic, no cyanosis or edema  EKG sinus rhythm at 73 with second-degree AV block (Mobitz 1). I personally reviewed this EKG  ASSESSMENT AND PLAN:   Wenckebach second degree AV block The patient continues to have asymptomatic wenckebach  second degree AV block   Sleep apnea History of obstructive sleep apnea on C Pap which he benefits from   Hypertension History of hypertension blood pressure measured at 122/78. He is on Benicar, hydrochlorothiazide.  Continue current meds at current dosing   Hyperlipidemia History of hyperlipidemia on Crestor 5 mg a day with recent lipid profile performed by his primary care physician 03/07/14 revealed a total cholesterol 169, LDL 87 and HDL of 43.   CAD RCA, PCI DES 4/11 History of CAD status post high-speed rotational atherectomy of his RCA with stenting by Dr. Ellouise Newer. His RCA had an anterior takeoff. He was unable to recanalize the PLA branch. I performed cardiac catheterization on him 11/21/12 revealed a patent mid RCA stent, occluded posterior lateral branch which is chronic and EF of 60%. He is asymptomatic.       Lorretta Harp MD FACP,FACC,FAHA, [facility] 08/21/2014 4:32 PM

## 2014-08-21 NOTE — Assessment & Plan Note (Signed)
History of CAD status post high-speed rotational atherectomy of his RCA with stenting by Dr. Ellouise Newer. His RCA had an anterior takeoff. He was unable to recanalize the PLA branch. I performed cardiac catheterization on him 11/21/12 revealed a patent mid RCA stent, occluded posterior lateral branch which is chronic and EF of 60%. He is asymptomatic.

## 2014-08-21 NOTE — Patient Instructions (Signed)
Your physician wants you to follow-up in: 1 year with Dr Berry. You will receive a reminder letter in the mail two months in advance. If you don't receive a letter, please call our office to schedule the follow-up appointment.  

## 2014-08-21 NOTE — Assessment & Plan Note (Signed)
The patient continues to have asymptomatic wenckebach  second degree AV block

## 2014-08-21 NOTE — Assessment & Plan Note (Signed)
History of obstructive sleep apnea on C Pap which he benefits from

## 2014-08-22 ENCOUNTER — Encounter: Payer: Self-pay | Admitting: Cardiovascular Disease

## 2014-08-27 ENCOUNTER — Other Ambulatory Visit: Payer: Self-pay | Admitting: Cardiovascular Disease

## 2014-08-27 ENCOUNTER — Telehealth: Payer: Self-pay | Admitting: Cardiovascular Disease

## 2014-08-27 NOTE — Telephone Encounter (Signed)
Jacob Romero is calling in to get permission from the doctor to hold the pt's plavix and aspirin so he can have a tooth extracted. Please f/u with her   Thanks

## 2014-08-27 NOTE — Telephone Encounter (Signed)
Re: dental extraction  Pt on plavix 75mg  & ASA 81mg  daily.  Cath w/ DES in 2011.  Will route to Dr. Gwenlyn Found for OK on Plavix hold, instruction on ASA.

## 2014-08-28 NOTE — Telephone Encounter (Signed)
Returned call to Gabriela Eves DDS office, spoke to [place], gave instruction based on Dr. Kennon Holter recommendation that it is OK to hold antiplatelet meds. They verbalized understanding.

## 2014-08-28 NOTE — Telephone Encounter (Signed)
Okay to hold antiplatelets therapy for his dental procedure

## 2014-08-28 NOTE — Telephone Encounter (Signed)
Jacob Romero called in stating that the pt needs a tooth extracted and would like to know if the pt could hold his plavix and aspirin 2 days prior to the procedure. Please call  Thanks

## 2014-08-31 ENCOUNTER — Encounter: Payer: Self-pay | Admitting: Cardiovascular Disease

## 2014-09-02 NOTE — Telephone Encounter (Signed)
I received an email from patient that requested I contact Dr Jimmye Norman' office about holding ASA and Plavix.  I contacted the office and left a message that Jacob Romero can hold ASA and Plavix prior to dental work.

## 2015-03-18 ENCOUNTER — Telehealth: Payer: Self-pay | Admitting: Cardiovascular Disease

## 2015-03-18 NOTE — Telephone Encounter (Signed)
Pt called in stating that he went to his family provider , Dr. Marcello Moores and he ordered a doppler for him to have done. The results came back that a blockage was found and he wanted the pt to follow up with Dr. Gwenlyn Found. Dr. Kennon Holter next available appt is 2/1, is this too long? Please speak with the pt  Thanks

## 2015-03-18 NOTE — Telephone Encounter (Signed)
Returned call to patient.He stated he has been having tightness in right calf when exercising and walking.Also numbness in right foot.Stated PCP ordered a LE doppler and it revealed a blockage.Stated he would like to see Dr.Berry before end of year.Appointment scheduled with Dr.Berry 03/25/15 at 10:00 am.Stated he will bring doppler report.

## 2015-03-25 ENCOUNTER — Other Ambulatory Visit: Payer: Self-pay | Admitting: Cardiovascular Disease

## 2015-03-25 ENCOUNTER — Encounter: Payer: Self-pay | Admitting: Cardiovascular Disease

## 2015-03-25 ENCOUNTER — Ambulatory Visit (INDEPENDENT_AMBULATORY_CARE_PROVIDER_SITE_OTHER): Payer: Medicare Other | Admitting: Cardiovascular Disease

## 2015-03-25 DIAGNOSIS — E785 Hyperlipidemia, unspecified: Secondary | ICD-10-CM

## 2015-03-25 DIAGNOSIS — I739 Peripheral vascular disease, unspecified: Secondary | ICD-10-CM

## 2015-03-25 DIAGNOSIS — I10 Essential (primary) hypertension: Secondary | ICD-10-CM | POA: Diagnosis not present

## 2015-03-25 DIAGNOSIS — I2510 Atherosclerotic heart disease of native coronary artery without angina pectoris: Secondary | ICD-10-CM | POA: Diagnosis not present

## 2015-03-25 NOTE — Assessment & Plan Note (Signed)
History of coronary artery disease status post RCA and PLA intervention. Dr. Claiborne Billings did high-speed rotational atherectomy, PCI and stenting of his mid RCA but was unable to recanalize his PLA branch. I catheterized him because of decline in his EF and an abnormal Myoview 11/21/12 revealing a patent RCA stent with an occluded posterior lateral branch which was chronic. His EF was 60%. He denies chest pain or shortness of breath.

## 2015-03-25 NOTE — Progress Notes (Signed)
03/25/2015 Jacob Romero   1950/12/22  [id number]  Primary Physician Quintella Reichert, MD Primary Cardiologist: Lorretta Harp MD [name]   HPI:  The patient is a very pleasant 64 year old severely overweight married Caucasian male father of 2, grandfather of 2 grandchildren who I saw 08/21/14.Jacob Romero He is accompanied by his wife Jacob Romero and son today.He has a history remarkable for hypertension, hyperlipidemia and known CAD. He does have sleep apnea on CPAP. He had above-to-below the knee popliteal bypass grafting by Dr. [name] after an occluded distal right SFA stent which he is now asymptomatic from and which we get annual Dopplers. I cathed him May 15, 2009 via the right radial approach revealing a high-grade mid RCA stenosis with a totally occluded PLA. There was an anterior takeoff. I attempted recanalization, however, he developed a subarachnoid hemorrhage and the procedure was aborted. Dr. Claiborne Billings brought him back and performed high-speed rotational atherectomy, PCI and stenting of his mid RCA but was unable to recanalize the PLA branch. He is completely asymptomatic with regards to this. He has got an excellent lipid profile for secondary prevention, followed closely by Dr. Micheal Likens. His total cholesterol is 140, LDL of 74 and HDL of 45.  I saw him back in the office 05/05/12 at which time he was currently stable. For the last several months he developed progressive dyspnea on exertion fatigue and lower extreme edema. The dyspnea was similar to his symptoms prior to intervention in the past.I obtained a 2-D echo which showed mild to moderate LV dysfunction with an EF in the 45% range and inferior hypokinesia. A Myoview stress test likewise showed LV dysfunction. Because of this I performed cardiac catheterization on him 11/21/12 revealing a patent mid RCA stent with an occluded posterolateral branch which is chronic. His EF was 60%. He continues to be symptomatic. The monitor showed  sinus rhythm, sinus tach and second degree AV block. I referred him to Dr. Thompson Grayer who adjusted his medications. He discontinued his beta blocker and his Mobic. He performed a cardiopulmonary exercise testing which suggested that his symptoms were related to deconditioning and obesity. The patient was clinically improved. I suggested that he seek the potential to dietitian for weight loss.since I saw him last January 2015 he has lost 25 pounds as a result of diet and exercise. He has clinically improved. He denies chest pain or shortness of breath. He retired in September and his wife Jacob Romero retired last week. He does complain of right calf claudication which is lifestyle limiting and which began approximately 6 months ago.   Current Outpatient Prescriptions  Medication Sig Dispense Refill  . acetaminophen (TYLENOL) 650 MG CR tablet Take 650 mg by mouth every 8 (eight) hours as needed for pain.    Jacob Romero ADVAIR DISKUS 250-50 MCG/DOSE AEPB Inhale 1 puff into the lungs 2 (two) times daily.    Jacob Romero albuterol (PROVENTIL HFA;VENTOLIN HFA) 108 (90 BASE) MCG/ACT inhaler Inhale 2 puffs into the lungs every 6 (six) hours as needed for wheezing or shortness of breath.     Jacob Romero amLODipine (NORVASC) 5 MG tablet Take 5 mg by mouth daily.     Jacob Romero aspirin EC 81 MG tablet Take 81 mg by mouth daily.      . Azelastine-Fluticasone (DYMISTA) 137-50 MCG/ACT SUSP Place 1 spray into the nose daily as needed (seasonal allergies).     . canagliflozin (INVOKANA) 300 MG TABS tablet Take 300 mg by mouth daily before breakfast.    .  cetirizine (ZYRTEC) 10 MG tablet Take 10 mg by mouth daily.    . clopidogrel (PLAVIX) 75 MG tablet TAKE 1 TABLET BY MOUTH DAILY. 30 tablet 11  . DULoxetine (CYMBALTA) 30 MG capsule Take 90 mg by mouth daily. Take 3 tablets daily.    . Fiber CAPS Take 16 capsules by mouth daily.     . fish oil-omega-3 fatty acids 1000 MG capsule Take 3 g by mouth daily.     . furosemide (LASIX) 40 MG tablet Take 1 tablet (40  mg total) by mouth daily. 30 tablet 6  . gabapentin (NEURONTIN) 400 MG capsule Take 400 mg by mouth as needed.    Jacob Romero ketoconazole (NIZORAL) 2 % cream     . losartan-hydrochlorothiazide (HYZAAR) 100-12.5 MG tablet Take 1 tablet by mouth daily.    . montelukast (SINGULAIR) 10 MG tablet Take 10 mg by mouth as needed.     . Multiple Vitamin (MULTIVITAMIN WITH MINERALS) TABS tablet Take 1 tablet by mouth daily.    . nitroGLYCERIN (NITROSTAT) 0.4 MG SL tablet Place 0.4 mg under the tongue every 5 (five) minutes as needed for chest pain.    . pantoprazole (PROTONIX) 40 MG tablet Take 1 tablet (40 mg total) by mouth daily. 30 tablet 11  . rosuvastatin (CRESTOR) 5 MG tablet Take 5 mg by mouth 2 (two) times a week. 2 times weekly.    . TESTIM 50 MG/5GM GEL     . [DISCONTINUED] BENICAR HCT 40-12.5 MG per tablet Take 1 tablet by mouth daily.      No current facility-administered medications for this visit.    Allergies  Allergen Reactions  . Statins     Unable to tolerate high dose statins    Social History   Social History  . Marital Status: Married    Spouse Name: N/A  . Number of Children: N/A  . Years of Education: N/A   Occupational History  . Not on file.   Social History Main Topics  . Smoking status: Former Smoker    Types: Cigarettes    Quit date: 06/01/1991  . Smokeless tobacco: Not on file  . Alcohol Use: No  . Drug Use: No  . Sexual Activity: Not on file   Other Topics Concern  . 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  Cardiovascular: negative for chest pain, dyspnea on exertion, edema, orthopnea, palpitations, paroxysmal nocturnal dyspnea or shortness of breath Dermatological: negative for rash Respiratory: negative for cough or wheezing Urologic: negative for hematuria Abdominal: negative for nausea, vomiting, diarrhea, bright red  blood per rectum, melena, or hematemesis Neurologic: negative for visual changes, syncope, or dizziness All other systems reviewed and are otherwise negative except as noted above.    Blood pressure 126/80, pulse 84, height 5\' 8"  (1.727 m), weight 246 lb (111.585 kg).  General appearance: alert and no distress Neck: no adenopathy, no carotid bruit, no JVD, supple, symmetrical, trachea midline and thyroid not enlarged, symmetric, no tenderness/mass/nodules Lungs: clear to auscultation bilaterally Heart: regular rate and rhythm, S1, S2 normal, no murmur, click, rub or gallop Extremities: extremities normal, atraumatic, no cyanosis or edema  EKG not performed today  ASSESSMENT AND PLAN:   PVD (peripheral vascular disease) History of peripheral arterial disease status post above the below the knee bypass grafting by Dr. Amedeo Plenty on the right side  after an occluded distal right SFA stent. His last Dopplers in our office performed 10/27/12 revealed normal ABIs bilaterally, and occluded right popliteal artery with a patent femoral to popliteal bypass graft. The last 6 months she's had progressive right calf claudication. I'm going to repeat Doppler studies and we'll see him back after that for further evaluation.  Hypertension History of hypertension blood pressure measured today at 126/80. He is on amlodipine, Benicar, hydrochlorothiazide. Continue current meds at current dosing  Hyperlipidemia History of hyperlipidemia on statin therapy followed by PCP  CAD RCA, PCI DES 4/11 History of coronary artery disease status post RCA and PLA intervention. Dr. Claiborne Billings did high-speed rotational atherectomy, PCI and stenting of his mid RCA but was unable to recanalize his PLA branch. I catheterized him because of decline in his EF and an abnormal Myoview 11/21/12 revealing a patent RCA stent with an occluded posterior lateral branch which was chronic. His EF was 60%. He denies chest pain or shortness of  breath.      Lorretta Harp MD FACP,FACC,FAHA, [facility] 03/25/2015 10:34 AM

## 2015-03-25 NOTE — Assessment & Plan Note (Signed)
History of hyperlipidemia on statin therapy followed by PCP 

## 2015-03-25 NOTE — Assessment & Plan Note (Signed)
History of hypertension blood pressure measured today at 126/80. He is on amlodipine, Benicar, hydrochlorothiazide. Continue current meds at current dosing

## 2015-03-25 NOTE — Assessment & Plan Note (Signed)
History of peripheral arterial disease status post above the below the knee bypass grafting by Dr. Amedeo Plenty on the right side after an occluded distal right SFA stent. His last Dopplers in our office performed 10/27/12 revealed normal ABIs bilaterally, and occluded right popliteal artery with a patent femoral to popliteal bypass graft. The last 6 months she's had progressive right calf claudication. I'm going to repeat Doppler studies and we'll see him back after that for further evaluation.

## 2015-03-25 NOTE — Patient Instructions (Signed)
Medication Instructions:  Your physician recommends that you continue on your current medications as directed. Please refer to the Current Medication list given to you today.   Labwork: none  Testing/Procedures: Your physician has requested that you have a lower extremity arterial doppler- During this test, ultrasound is used to evaluate arterial blood flow in the legs. Allow approximately one hour for this exam.    Follow-Up: Your physician wants you to follow-up in: 6 months with Dr. Gwenlyn Found. You will receive a reminder letter in the mail two months in advance. If you don't receive a letter, please call our office to schedule the follow-up appointment.   Any Other Special Instructions Will Be Listed Below (If Applicable).     If you need a refill on your cardiac medications before your next appointment, please call your pharmacy.

## 2015-04-02 ENCOUNTER — Ambulatory Visit (HOSPITAL_COMMUNITY)
Admission: RE | Admit: 2015-04-02 | Discharge: 2015-04-02 | Disposition: A | Discharge status: Home / Self Care | Payer: Medicare Other | Source: Ambulatory Visit | Attending: Cardiovascular Disease | Admitting: Cardiovascular Disease

## 2015-04-02 ENCOUNTER — Inpatient Hospital Stay (HOSPITAL_COMMUNITY): Admission: RE | Admit: 2015-04-02 | Payer: Medicare Other | Source: Ambulatory Visit

## 2015-04-02 DIAGNOSIS — I70201 Unspecified atherosclerosis of native arteries of extremities, right leg: Secondary | ICD-10-CM | POA: Diagnosis not present

## 2015-04-02 DIAGNOSIS — I10 Essential (primary) hypertension: Secondary | ICD-10-CM | POA: Insufficient documentation

## 2015-04-02 DIAGNOSIS — R938 Abnormal findings on diagnostic imaging of other specified body structures: Secondary | ICD-10-CM | POA: Diagnosis not present

## 2015-04-02 DIAGNOSIS — I739 Peripheral vascular disease, unspecified: Secondary | ICD-10-CM | POA: Diagnosis present

## 2015-04-02 DIAGNOSIS — E785 Hyperlipidemia, unspecified: Secondary | ICD-10-CM | POA: Insufficient documentation

## 2015-04-15 ENCOUNTER — Encounter: Payer: Self-pay | Admitting: Cardiovascular Disease

## 2015-04-15 ENCOUNTER — Telehealth: Payer: Self-pay | Admitting: *Deleted

## 2015-04-15 ENCOUNTER — Ambulatory Visit (INDEPENDENT_AMBULATORY_CARE_PROVIDER_SITE_OTHER): Payer: Medicare Other | Admitting: Cardiovascular Disease

## 2015-04-15 ENCOUNTER — Ambulatory Visit
Admission: RE | Admit: 2015-04-15 | Discharge: 2015-04-15 | Disposition: A | Discharge status: Home / Self Care | Payer: Medicare Other | Source: Ambulatory Visit | Attending: Cardiovascular Disease | Admitting: Cardiovascular Disease

## 2015-04-15 VITALS — BP 138/80 | HR 72 | Ht 68.0 in | Wt 251.0 lb

## 2015-04-15 DIAGNOSIS — I10 Essential (primary) hypertension: Secondary | ICD-10-CM

## 2015-04-15 DIAGNOSIS — I739 Peripheral vascular disease, unspecified: Secondary | ICD-10-CM

## 2015-04-15 DIAGNOSIS — E785 Hyperlipidemia, unspecified: Secondary | ICD-10-CM | POA: Diagnosis not present

## 2015-04-15 LAB — CBC WITH DIFFERENTIAL/PLATELET
BASOS PCT: 0 % (ref 0–1)
Basophils Absolute: 0 10*3/uL (ref 0.0–0.1)
EOS ABS: 0 10*3/uL (ref 0.0–0.7)
Eosinophils Relative: 1 % (ref 0–5)
HCT: 48.7 % (ref 39.0–52.0)
Hemoglobin: 16.4 g/dL (ref 13.0–17.0)
Lymphocytes Relative: 44 % (ref 12–46)
Lymphs Abs: 1.7 10*3/uL (ref 0.7–4.0)
MCH: 28 pg (ref 26.0–34.0)
MCHC: 33.7 g/dL (ref 30.0–36.0)
MCV: 83.2 fL (ref 78.0–100.0)
MONO ABS: 0.7 10*3/uL (ref 0.1–1.0)
MPV: 9.4 fL (ref 8.6–12.4)
Monocytes Relative: 18 % — ABNORMAL HIGH (ref 3–12)
NEUTROS PCT: 37 % — AB (ref 43–77)
Neutro Abs: 1.4 10*3/uL — ABNORMAL LOW (ref 1.7–7.7)
PLATELETS: 219 10*3/uL (ref 150–400)
RBC: 5.85 MIL/uL — ABNORMAL HIGH (ref 4.22–5.81)
RDW: 15.8 % — ABNORMAL HIGH (ref 11.5–15.5)
WBC: 3.8 10*3/uL — ABNORMAL LOW (ref 4.0–10.5)

## 2015-04-15 NOTE — Assessment & Plan Note (Signed)
History of peripheral arterial disease status post right above the below the knee popliteal bypass by Dr. Amedeo Plenty after restenosis of a distal right SFA stent remotely. Over the last 3-4 months she's had right calf claudication. Recent lower extremity Dopplers revealed a right ABI 0.74 with a patent bypass graft but a high-grade stenosis in the proximal SFA versus distal common femoral artery with shadowing suggesting a calcified lesion. Based on this, we decided to proceed with angiography and potential endovascular therapy.

## 2015-04-15 NOTE — Telephone Encounter (Signed)
Raiann called from [facility] for additional Dx codes for patient encounter (APTT lab draw).  Noted relevant codes based on pt history - she will try these and call back if problems.

## 2015-04-15 NOTE — Patient Instructions (Signed)
Medication Instructions:  Your physician recommends that you continue on your current medications as directed. Please refer to the Current Medication list given to you today.   Labwork: Your physician recommends that you return for lab work in: Today  The lab can be found on the FIRST FLOOR of out building in [place]   Testing/Procedures: Dr. Gwenlyn Found has ordered a peripheral angiogram to be done at [facility].  This procedure is going to look at the bloodflow in your lower extremities.  If Dr. Gwenlyn Found is able to open up the arteries, you will have to spend one night in the hospital.  If he is not able to open the arteries, you will be able to go home that same day.  SCHEDULE FOR Monday 1/23  After the procedure, you will not be allowed to drive for 3 days or push, pull, or lift anything greater than 10 lbs for one week.    You will be required to have the following tests prior to the procedure:  1. Blood work-the blood work can be done no more than 7 days prior to the procedure.  It can be done at any [facility] lab.  There is one downstairs on the first floor of this building and one in the [facility] building [phone] [address])  2. Chest Xray-the chest xray order has already been placed at the [place].     *REPS: SCOTT AND ERIC  Puncture site: Left Groin     Any Other Special Instructions Will Be Listed Below (If Applicable).     If you need a refill on your cardiac medications before your next appointment, please call your pharmacy.

## 2015-04-15 NOTE — Progress Notes (Signed)
History of peripheral arterial disease status post right above the below the knee popliteal bypass by Dr. Amedeo Plenty after restenosis of a distal right SFA stent remotely. Over the last 3-4 months she's had right calf claudication. Recent lower extremity Dopplers revealed a right ABI 0.74 with a patent bypass graft but a high-grade stenosis in the proximal SFA versus distal common femoral artery with shadowing suggesting a calcified lesion. Based on this, we decided to proceed with angiography and potential endovascular therapy.

## 2015-04-16 LAB — BASIC METABOLIC PANEL
BUN: 15 mg/dL (ref 7–25)
CALCIUM: 10 mg/dL (ref 8.6–10.3)
CHLORIDE: 101 mmol/L (ref 98–110)
CO2: 29 mmol/L (ref 20–31)
CREATININE: 0.82 mg/dL (ref 0.70–1.25)
GLUCOSE: 91 mg/dL (ref 65–99)
Potassium: 3.8 mmol/L (ref 3.5–5.3)
Sodium: 139 mmol/L (ref 135–146)

## 2015-04-16 LAB — APTT: aPTT: 36 seconds (ref 24–37)

## 2015-04-16 LAB — PROTIME-INR
INR: 1.1 (ref <1.50)
PROTHROMBIN TIME: 14.3 s (ref 11.6–15.2)

## 2015-04-16 LAB — TSH: TSH: 0.712 u[IU]/mL (ref 0.350–4.500)

## 2015-04-21 ENCOUNTER — Encounter (HOSPITAL_COMMUNITY)
Admission: RE | Disposition: A | Discharge status: Home / Self Care | Payer: Self-pay | Source: Ambulatory Visit | Attending: Cardiovascular Disease

## 2015-04-21 ENCOUNTER — Ambulatory Visit (HOSPITAL_COMMUNITY)
Admission: RE | Admit: 2015-04-21 | Discharge: 2015-04-21 | Disposition: A | Discharge status: Home / Self Care | Payer: Medicare Other | Source: Ambulatory Visit | Attending: Cardiovascular Disease | Admitting: Cardiovascular Disease

## 2015-04-21 ENCOUNTER — Telehealth: Payer: Self-pay | Admitting: Vascular Surgery

## 2015-04-21 ENCOUNTER — Encounter (HOSPITAL_COMMUNITY): Payer: Self-pay | Admitting: Cardiovascular Disease

## 2015-04-21 DIAGNOSIS — E663 Overweight: Secondary | ICD-10-CM | POA: Diagnosis not present

## 2015-04-21 DIAGNOSIS — Z87891 Personal history of nicotine dependence: Secondary | ICD-10-CM | POA: Diagnosis not present

## 2015-04-21 DIAGNOSIS — Z6836 Body mass index (BMI) 36.0-36.9, adult: Secondary | ICD-10-CM | POA: Insufficient documentation

## 2015-04-21 DIAGNOSIS — E785 Hyperlipidemia, unspecified: Secondary | ICD-10-CM | POA: Diagnosis not present

## 2015-04-21 DIAGNOSIS — Z7951 Long term (current) use of inhaled steroids: Secondary | ICD-10-CM | POA: Diagnosis not present

## 2015-04-21 DIAGNOSIS — Z7902 Long term (current) use of antithrombotics/antiplatelets: Secondary | ICD-10-CM | POA: Diagnosis not present

## 2015-04-21 DIAGNOSIS — I739 Peripheral vascular disease, unspecified: Secondary | ICD-10-CM | POA: Insufficient documentation

## 2015-04-21 DIAGNOSIS — G4730 Sleep apnea, unspecified: Secondary | ICD-10-CM | POA: Diagnosis not present

## 2015-04-21 DIAGNOSIS — Z95820 Peripheral vascular angioplasty status with implants and grafts: Secondary | ICD-10-CM | POA: Diagnosis not present

## 2015-04-21 DIAGNOSIS — I10 Essential (primary) hypertension: Secondary | ICD-10-CM | POA: Diagnosis not present

## 2015-04-21 DIAGNOSIS — I441 Atrioventricular block, second degree: Secondary | ICD-10-CM | POA: Diagnosis not present

## 2015-04-21 DIAGNOSIS — Z7982 Long term (current) use of aspirin: Secondary | ICD-10-CM | POA: Insufficient documentation

## 2015-04-21 DIAGNOSIS — I2510 Atherosclerotic heart disease of native coronary artery without angina pectoris: Secondary | ICD-10-CM | POA: Diagnosis not present

## 2015-04-21 DIAGNOSIS — I7092 Chronic total occlusion of artery of the extremities: Secondary | ICD-10-CM | POA: Insufficient documentation

## 2015-04-21 DIAGNOSIS — I70211 Atherosclerosis of native arteries of extremities with intermittent claudication, right leg: Secondary | ICD-10-CM

## 2015-04-21 HISTORY — PX: PERIPHERAL VASCULAR CATHETERIZATION: SHX172C

## 2015-04-21 LAB — GLUCOSE, CAPILLARY: Glucose-Capillary: 95 mg/dL (ref 65–99)

## 2015-04-21 SURGERY — LOWER EXTREMITY ANGIOGRAPHY

## 2015-04-21 MED ORDER — ASPIRIN 81 MG PO CHEW
CHEWABLE_TABLET | ORAL | Status: AC
  Filled 2015-04-21: qty 1

## 2015-04-21 MED ORDER — MIDAZOLAM HCL 2 MG/2ML IJ SOLN
INTRAMUSCULAR | Status: DC | PRN
  Administered 2015-04-21: 1 mg via INTRAVENOUS

## 2015-04-21 MED ORDER — SODIUM CHLORIDE 0.9 % WEIGHT BASED INFUSION
3.0000 mL/kg/h | INTRAVENOUS | Status: AC
  Administered 2015-04-21: 3 mL/kg/h via INTRAVENOUS

## 2015-04-21 MED ORDER — MORPHINE SULFATE (PF) 2 MG/ML IV SOLN: 2.0000 mg | INTRAVENOUS | Status: DC | PRN

## 2015-04-21 MED ORDER — SODIUM CHLORIDE 0.9 % WEIGHT BASED INFUSION: 1.0000 mL/kg/h | INTRAVENOUS | Status: DC

## 2015-04-21 MED ORDER — LIDOCAINE HCL (PF) 1 % IJ SOLN
INTRAMUSCULAR | Status: DC | PRN
  Administered 2015-04-21: 08:00:00

## 2015-04-21 MED ORDER — ASPIRIN EC 325 MG PO TBEC: 325.0000 mg | DELAYED_RELEASE_TABLET | Freq: Every day | ORAL | Status: DC

## 2015-04-21 MED ORDER — FENTANYL CITRATE (PF) 100 MCG/2ML IJ SOLN
INTRAMUSCULAR | Status: AC
  Filled 2015-04-21: qty 2

## 2015-04-21 MED ORDER — SODIUM CHLORIDE 0.9 % IV SOLN: 250.0000 mL | INTRAVENOUS | Status: DC | PRN

## 2015-04-21 MED ORDER — ACETAMINOPHEN 325 MG PO TABS: 650.0000 mg | ORAL_TABLET | ORAL | Status: DC | PRN

## 2015-04-21 MED ORDER — HEPARIN (PORCINE) IN NACL 2-0.9 UNIT/ML-% IJ SOLN
INTRAMUSCULAR | Status: AC
  Filled 2015-04-21: qty 1000

## 2015-04-21 MED ORDER — SODIUM CHLORIDE 0.9 % IJ SOLN: 3.0000 mL | INTRAMUSCULAR | Status: DC | PRN

## 2015-04-21 MED ORDER — ASPIRIN 81 MG PO CHEW
81.0000 mg | CHEWABLE_TABLET | ORAL | Status: AC
  Administered 2015-04-21: 81 mg via ORAL

## 2015-04-21 MED ORDER — FENTANYL CITRATE (PF) 100 MCG/2ML IJ SOLN
INTRAMUSCULAR | Status: DC | PRN
  Administered 2015-04-21 (×2): 25 ug via INTRAVENOUS

## 2015-04-21 MED ORDER — ONDANSETRON HCL 4 MG/2ML IJ SOLN
INTRAMUSCULAR | Status: AC
  Filled 2015-04-21: qty 2

## 2015-04-21 MED ORDER — ONDANSETRON HCL 4 MG/2ML IJ SOLN
4.0000 mg | Freq: Four times a day (QID) | INTRAMUSCULAR | Status: DC | PRN
  Administered 2015-04-21: 4 mg via INTRAVENOUS

## 2015-04-21 MED ORDER — MIDAZOLAM HCL 2 MG/2ML IJ SOLN
INTRAMUSCULAR | Status: AC
  Filled 2015-04-21: qty 2

## 2015-04-21 MED ORDER — SODIUM CHLORIDE 0.9 % IV SOLN: INTRAVENOUS | Status: AC

## 2015-04-21 MED ORDER — SODIUM CHLORIDE 0.9 % IJ SOLN: 3.0000 mL | Freq: Two times a day (BID) | INTRAMUSCULAR | Status: DC

## 2015-04-21 MED ORDER — LIDOCAINE HCL (PF) 1 % IJ SOLN
INTRAMUSCULAR | Status: AC
  Filled 2015-04-21: qty 30

## 2015-04-21 SURGICAL SUPPLY — 9 items
CATH ANGIO 5F PIGTAIL 65CM (CATHETERS) ×2 IMPLANT
CATH CROSS OVER TEMPO 5F (CATHETERS) ×2 IMPLANT
CATH STRAIGHT 5FR 65CM (CATHETERS) ×2 IMPLANT
KIT PV (KITS) ×3 IMPLANT
SHEATH PINNACLE 5F 10CM (SHEATH) ×2 IMPLANT
SYRINGE MEDRAD AVANTA MACH 7 (SYRINGE) ×2 IMPLANT
TRANSDUCER W/STOPCOCK (MISCELLANEOUS) ×3 IMPLANT
TRAY PV CATH (CUSTOM PROCEDURE TRAY) ×3 IMPLANT
WIRE HITORQ VERSACORE ST 145CM (WIRE) ×2 IMPLANT

## 2015-04-21 NOTE — H&P (Signed)
RB:[id number]  H & P  Primary Physician Quintella Reichert, MD Primary Cardiologist: Lorretta Harp MD [name]   HPI: The patient is a very pleasant 65 year old severely overweight married Caucasian male father of 2, grandfather of 2 grandchildren who I saw 08/21/14.[name] He is accompanied by his wife [name] and son today.He has a history remarkable for hypertension, hyperlipidemia and known CAD. He does have sleep apnea on CPAP. He had above-to-below the knee popliteal bypass grafting by Dr. [name] after an occluded distal right SFA stent which he is now asymptomatic from and which we get annual Dopplers. I cathed him May 15, 2009 via the right radial approach revealing a high-grade mid RCA stenosis with a totally occluded PLA. There was an anterior takeoff. I attempted recanalization, however, he developed a subarachnoid hemorrhage and the procedure was aborted. Dr. Claiborne Billings brought him back and performed high-speed rotational atherectomy, PCI and stenting of his mid RCA but was unable to recanalize the PLA branch. He is completely asymptomatic with regards to this. He has got an excellent lipid profile for secondary prevention, followed closely by Dr. Micheal Likens. His total cholesterol is 140, LDL of 74 and HDL of 45.  I saw him back in the office 05/05/12 at which time he was currently stable. For the last several months he developed progressive dyspnea on exertion fatigue and lower extreme edema. The dyspnea was similar to his symptoms prior to intervention in the past.I obtained a 2-D echo which showed mild to moderate LV dysfunction with an EF in the 45% range and inferior hypokinesia. A Myoview stress test likewise showed LV dysfunction. Because of this I performed cardiac catheterization on him 11/21/12 revealing a patent mid RCA stent with an occluded posterolateral branch which is chronic. His EF was 60%. He continues to be symptomatic. The monitor showed sinus rhythm, sinus tach and second  degree AV block. I referred him to Dr. Thompson Grayer who adjusted his medications. He discontinued his beta blocker and his Mobic. He performed a cardiopulmonary exercise testing which suggested that his symptoms were related to deconditioning and obesity. The patient was clinically improved. I suggested that he seek the potential to dietitian for weight loss.since I saw him last January 2015 he has lost 25 pounds as a result of diet and exercise. He has clinically improved. He denies chest pain or shortness of breath. He retired in September and his wife [name] retired last week. He does complain of right calf claudication which is lifestyle limiting and which began approximately 6 months ago.   Current Outpatient Prescriptions  Medication Sig Dispense Refill  . acetaminophen (TYLENOL) 650 MG CR tablet Take 650 mg by mouth every 8 (eight) hours as needed for pain.    [name] ADVAIR DISKUS 250-50 MCG/DOSE AEPB Inhale 1 puff into the lungs 2 (two) times daily.    [name] albuterol (PROVENTIL HFA;VENTOLIN HFA) 108 (90 BASE) MCG/ACT inhaler Inhale 2 puffs into the lungs every 6 (six) hours as needed for wheezing or shortness of breath.     [name] amLODipine (NORVASC) 5 MG tablet Take 5 mg by mouth daily.     [name] aspirin EC 81 MG tablet Take 81 mg by mouth daily.     . Azelastine-Fluticasone (DYMISTA) 137-50 MCG/ACT SUSP Place 1 spray into the nose daily as needed (seasonal allergies).     . canagliflozin (INVOKANA) 300 MG TABS tablet Take 300 mg by mouth daily before breakfast.    . cetirizine (ZYRTEC) 10 MG tablet Take 10  mg by mouth daily.    . clopidogrel (PLAVIX) 75 MG tablet TAKE 1 TABLET BY MOUTH DAILY. 30 tablet 11  . DULoxetine (CYMBALTA) 30 MG capsule Take 90 mg by mouth daily. Take 3 tablets daily.    . Fiber CAPS Take 16 capsules by mouth daily.     . fish oil-omega-3 fatty acids 1000 MG capsule Take 3 g by mouth daily.     . furosemide  (LASIX) 40 MG tablet Take 1 tablet (40 mg total) by mouth daily. 30 tablet 6  . gabapentin (NEURONTIN) 400 MG capsule Take 400 mg by mouth as needed.    [name] ketoconazole (NIZORAL) 2 % cream     . losartan-hydrochlorothiazide (HYZAAR) 100-12.5 MG tablet Take 1 tablet by mouth daily.    . montelukast (SINGULAIR) 10 MG tablet Take 10 mg by mouth as needed.     . Multiple Vitamin (MULTIVITAMIN WITH MINERALS) TABS tablet Take 1 tablet by mouth daily.    . nitroGLYCERIN (NITROSTAT) 0.4 MG SL tablet Place 0.4 mg under the tongue every 5 (five) minutes as needed for chest pain.    . pantoprazole (PROTONIX) 40 MG tablet Take 1 tablet (40 mg total) by mouth daily. 30 tablet 11  . rosuvastatin (CRESTOR) 5 MG tablet Take 5 mg by mouth 2 (two) times a week. 2 times weekly.    . TESTIM 50 MG/5GM GEL     . [DISCONTINUED] BENICAR HCT 40-12.5 MG per tablet Take 1 tablet by mouth daily.      No current facility-administered medications for this visit.    Allergies  Allergen Reactions  . Statins     Unable to tolerate high dose statins    Social History   Social History  . Marital Status: Married    Spouse Name: N/A  . Number of Children: N/A  . Years of Education: N/A   Occupational History  . Not on file.   Social History Main Topics  . Smoking status: Former Smoker    Types: Cigarettes    Quit date: 06/01/1991  . Smokeless tobacco: Not on file  . Alcohol Use: No  . Drug Use: No  . Sexual Activity: Not on file   Other Topics Concern  . 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  Cardiovascular: negative for chest pain, dyspnea on exertion, edema, orthopnea, palpitations, paroxysmal nocturnal dyspnea or shortness of  breath Dermatological: negative for rash Respiratory: negative for cough or wheezing Urologic: negative for hematuria Abdominal: negative for nausea, vomiting, diarrhea, bright red blood per rectum, melena, or hematemesis Neurologic: negative for visual changes, syncope, or dizziness All other systems reviewed and are otherwise negative except as noted above.    Blood pressure 126/80, pulse 84, height 5\' 8"  (1.727 m), weight 246 lb (111.585 kg).  General appearance: alert and no distress Neck: no adenopathy, no carotid bruit, no JVD, supple, symmetrical, trachea midline and thyroid not enlarged, symmetric, no tenderness/mass/nodules Lungs: clear to auscultation bilaterally Heart: regular rate and rhythm, S1, S2 normal, no murmur, click, rub or gallop Extremities: extremities normal, atraumatic, no cyanosis or edema  EKG not performed today  ASSESSMENT AND PLAN:   PVD (peripheral vascular disease) History of peripheral arterial disease status post above the below the knee bypass grafting by Dr. Amedeo Plenty on the right side after an occluded distal right SFA stent.  His last Dopplers in our office performed 10/27/12 revealed normal ABIs bilaterally, and occluded right popliteal artery with a patent femoral to popliteal bypass graft. The last 6 months she's had progressive right calf claudication. I obtained LEAs which showed an intact Right above to below the knee BG but an obstructive lesion in the RCFA. He presents no for angio and possible intervention for life style limiting claudication.  Hypertension History of hypertension blood pressure measured today at 126/80. He is on amlodipine, Benicar, hydrochlorothiazide. Continue current meds at current dosing  Hyperlipidemia History of hyperlipidemia on statin therapy followed by PCP  CAD RCA, PCI DES 4/11 History of coronary artery disease status post RCA and PLA intervention. Dr. Claiborne Billings did high-speed rotational atherectomy, PCI and stenting of  his mid RCA but was unable to recanalize his PLA branch. I catheterized him because of decline in his EF and an abnormal Myoview 11/21/12 revealing a patent RCA stent with an occluded posterior lateral branch which was chronic. His EF was 60%. He denies chest pain or shortness of breath.  Lorretta Harp, M.D., [place], [facility], [place] [address] [place], [place]  [address]  [phone] 04/21/2015 7:00 AM

## 2015-04-21 NOTE — Discharge Instructions (Signed)

## 2015-04-21 NOTE — Telephone Encounter (Signed)
-----   Message from Angelia Mould, MD sent at 04/21/2015  8:31 AM EST ----- Regarding: appt [name] would like me to see this patient. He will need vein map of the right GSV. Also Amedeo Plenty did bypass before and it would be helpful to have those records.  Thanks CD

## 2015-04-21 NOTE — Telephone Encounter (Signed)
LM for pt with appt info, dpm °

## 2015-04-21 NOTE — Progress Notes (Addendum)
Site area: Left groin a 5 french arterial sheath was removed  Site Prior to Removal:  Level 0  Pressure Applied For 15 MINUTES    Minutes Beginning at 0830am  Manual:   Yes.    Patient Status During Pull:  stable  Post Pull Groin Site:  Level 0  Post Pull Instructions Given:  Yes.    Post Pull Pulses Present:  Yes.    Dressing Applied:  Yes.    Comments:Pt had a vagal reaction during sheath pull.  Zofran 4mg  IV given for nausea and IVF bolus of 200cc .  BP now is 111/ 66, HR 91                         Resp 15 and sat at 91 on room air.  Nausea subsided with Zofran

## 2015-04-24 ENCOUNTER — Encounter: Payer: Self-pay | Admitting: Cardiovascular Disease

## 2015-05-01 ENCOUNTER — Encounter: Payer: Self-pay | Admitting: Vascular Surgery

## 2015-05-21 ENCOUNTER — Encounter: Payer: Medicare Other | Admitting: Vascular Surgery

## 2015-05-21 ENCOUNTER — Encounter (HOSPITAL_COMMUNITY): Payer: Medicare Other

## 2015-05-22 ENCOUNTER — Encounter: Payer: Self-pay | Admitting: Vascular Surgery

## 2015-05-26 ENCOUNTER — Other Ambulatory Visit: Payer: Self-pay | Admitting: *Deleted

## 2015-05-26 DIAGNOSIS — I739 Peripheral vascular disease, unspecified: Secondary | ICD-10-CM

## 2015-05-28 ENCOUNTER — Ambulatory Visit (INDEPENDENT_AMBULATORY_CARE_PROVIDER_SITE_OTHER): Payer: Medicare Other | Admitting: Vascular Surgery

## 2015-05-28 ENCOUNTER — Ambulatory Visit (HOSPITAL_COMMUNITY)
Admission: RE | Admit: 2015-05-28 | Discharge: 2015-05-28 | Disposition: A | Discharge status: Home / Self Care | Payer: Medicare Other | Source: Ambulatory Visit | Attending: Vascular Surgery | Admitting: Vascular Surgery

## 2015-05-28 ENCOUNTER — Encounter: Payer: Self-pay | Admitting: Vascular Surgery

## 2015-05-28 VITALS — BP 133/88 | HR 77 | Temp 98.4°F | Resp 16 | Ht 68.5 in | Wt 257.0 lb

## 2015-05-28 DIAGNOSIS — E785 Hyperlipidemia, unspecified: Secondary | ICD-10-CM | POA: Insufficient documentation

## 2015-05-28 DIAGNOSIS — I739 Peripheral vascular disease, unspecified: Secondary | ICD-10-CM | POA: Diagnosis not present

## 2015-05-28 DIAGNOSIS — Z01818 Encounter for other preprocedural examination: Secondary | ICD-10-CM | POA: Insufficient documentation

## 2015-05-28 DIAGNOSIS — I10 Essential (primary) hypertension: Secondary | ICD-10-CM | POA: Diagnosis not present

## 2015-05-28 DIAGNOSIS — I70209 Unspecified atherosclerosis of native arteries of extremities, unspecified extremity: Secondary | ICD-10-CM

## 2015-05-28 NOTE — Progress Notes (Addendum)
[facility] [place]  Patient name: Jacob Romero MRN: [id number] DOB: May 31, 1950 Sex: male  REASON FOR CONSULT: Disabling right lower extremity claudication. Referred by [name].  HPI: Jacob Romero is a 65 y.o. male, who underwent a right above-knee to below knee popliteal artery bypass with a vein graft by Dr. [name] in 2008. He was being followed by [name] with right lower extremity claudication. He experiences paresthesias in his right foot after ambulate for 15-20 minutes. He has right calf claudication which is brought on by ambulation and relieved with rest. He denies any history of rest pain. He denies any history of symptoms in the left leg.  His risk factors for peripheral vascular disease include type 2 diabetes, hypertension, hyperlipidemia, and a remote history of tobacco use.  He underwent an arteriogram on 04/21/2015 which showed a bulky calcific plaque in his right common femoral artery with an occluded superficial femoral artery. His bypass graft was being maintained through collaterals into the distal superficial femoral artery. It's really quite remarkable at the bypass remained patent despite the superficial femoral artery occlusion.  Past Medical History  Diagnosis Date  . Hyperlipidemia   . Hypertension   . Depression   . GERD (gastroesophageal reflux disease)   . Hiatal hernia   . CAD (coronary artery disease) 2011    RCA HSRA, known occlued PDA  . Sleep apnea     on C-pap  . PVD (peripheral vascular disease) ([place])     Rt FPBPG  . Dyspnea on exertion   . Bilateral lower extremity edema   . Wenckebach second degree AV block     Family History  Problem Relation Age of Onset  . Adopted: Yes    SOCIAL HISTORY: Social History   Social History  . Marital Status: Married    Spouse Name: N/A  . Number of Children: N/A  . Years of Education: N/A   Occupational History  . Not on file.   Social History  Main Topics  . Smoking status: Former Smoker    Types: Cigarettes    Quit date: 06/01/1991  . Smokeless tobacco: Never Used  . Alcohol Use: No  . Drug Use: No  . Sexual Activity: Not on file   Other Topics Concern  . 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 Statins     Unable to tolerate high dose statins    Current Outpatient Prescriptions  Medication Sig Dispense Refill  . acetaminophen (TYLENOL) 650 MG CR tablet Take 500 mg by mouth every 8 (eight) hours as needed for pain.     [name] ADVAIR DISKUS 250-50 MCG/DOSE AEPB Inhale 1 puff into the lungs daily.     [name] albuterol (PROVENTIL HFA;VENTOLIN HFA) 108 (90 BASE) MCG/ACT inhaler Inhale 2 puffs into the lungs every 6 (six) hours as needed for wheezing or shortness of breath.     [name] amLODipine (NORVASC) 5 MG tablet Take 5 mg by mouth daily.     [name] aspirin EC 81 MG tablet Take 81 mg by mouth daily.      . canagliflozin (INVOKANA) 300 MG TABS tablet Take 300 mg by mouth daily before breakfast.    . cetirizine (ZYRTEC) 10 MG tablet Take 10 mg by mouth daily as needed for allergies.     [name] clopidogrel (PLAVIX) 75 MG tablet TAKE 1 TABLET  BY MOUTH DAILY. 30 tablet 11  . DULoxetine (CYMBALTA) 30 MG capsule Take 90 mg by mouth daily. Take 3 tablets daily.    . Fiber CAPS Take 8 capsules by mouth daily.     . fish oil-omega-3 fatty acids 1000 MG capsule Take 3 g by mouth daily.     [name] ketoconazole (NIZORAL) 2 % cream Apply 1 application topically daily.    [name] LANCETS MICRO THIN 33G MISC by Does not apply route 2 (two) times daily.    [name] losartan-hydrochlorothiazide (HYZAAR) 100-12.5 MG tablet Take 1 tablet by mouth daily.    . Multiple Vitamin (MULTIVITAMIN WITH MINERALS) TABS tablet Take 1 tablet by mouth daily.    . nitroGLYCERIN (NITROSTAT) 0.4 MG SL tablet Place 0.4 mg under the tongue every 5 (five) minutes as needed for chest pain.    . pantoprazole (PROTONIX) 40 MG  tablet Take 1 tablet (40 mg total) by mouth daily. 30 tablet 11  . rosuvastatin (CRESTOR) 5 MG tablet Take 5 mg by mouth 2 (two) times a week. 2 times weekly.    [name] testosterone (ANDROGEL) 50 MG/5GM (1%) GEL Place 5 g onto the skin daily.    . Azelastine-Fluticasone (DYMISTA) 137-50 MCG/ACT SUSP Place 1 spray into the nose daily as needed (seasonal allergies). Reported on 05/28/2015     No current facility-administered medications for this visit.    REVIEW OF SYSTEMS:  [X]  denotes positive finding, [ ]  denotes negative finding Cardiac  Comments:  Chest pain or chest pressure:    Shortness of breath upon exertion:    Short of breath when lying flat:    Irregular heart rhythm:        Vascular    Pain in calf, thigh, or hip brought on by ambulation: X Right calf  Pain in feet at night that wakes you up from your sleep:     Blood clot in your veins:    Leg swelling:         Pulmonary    Oxygen at home:    Productive cough:     Wheezing:         Neurologic    Sudden weakness in arms or legs:     Sudden numbness in arms or legs:     Sudden onset of difficulty speaking or slurred speech:    Temporary loss of vision in one eye:     Problems with dizziness:         Gastrointestinal    Blood in stool:     Vomited blood:         Genitourinary    Burning when urinating:     Blood in urine:        Psychiatric    Major depression:         Hematologic    Bleeding problems:    Problems with blood clotting too easily:        Skin    Rashes or ulcers:        Constitutional    Fever or chills:      PHYSICAL EXAM: Filed Vitals:   05/28/15 1241  BP: 133/88  Pulse: 77  Temp: 98.4 F (36.9 C)  Resp: 16  Height: 5' 8.5" (1.74 m)  Weight: 257 lb (116.574 kg)  SpO2: 95%    GENERAL: The patient is a well-nourished male, in no acute distress. The vital signs are documented above. CARDIAC: There is a regular rate and rhythm.  VASCULAR: do not detect  carotid bruits. He has  palpable femoral pulses. On the right side I cannot palpate a popliteal or pedal pulses. On the left side he has palpable posterior tibial and dorsalis pedis pulses. PULMONARY: There is good air exchange bilaterally without wheezing or rales. ABDOMEN: Soft and non-tender with normal pitched bowel sounds.  MUSCULOSKELETAL: There are no major deformities or cyanosis. NEUROLOGIC: No focal weakness or paresthesias are detected. SKIN: There are no ulcers or rashes noted. PSYCHIATRIC: The patient has a normal affect.  DATA:  I have reviewed his arteriogram that was performed on 04/21/2015. He has a bulky calcific plaque in the right common femoral artery. The superficial femoral artery on the right is occluded. The distal superficial femoral artery reconstitutes via collaterals and this feeds the above-knee to below knee popliteal artery bypass graft which is patent. Proximal tibial vessels are patent.  SAPHENOUS VEIN MAP: I have independently interpreted his saphenous vein map. He does have a short segment of saphenous vein in the right thigh which appears usable but is likely not adequate length. On the left side the diameters of the saphenous vein look adequate in size.  MEDICAL ISSUES:  STATUS POST RIGHT ABOVE-KNEE TO BELOW-KNEE POPLITEAL ARTERY BYPASS: This patient has an occluded superficial femoral artery proximal to his bypass graft. It is really quite remarkable that the bypass graft has remained patent despite this. Given the risk of graft thrombosis I have recommended right common femoral artery endarterectomy and bypass from the common femoral artery to his old bypass graft above the knee. Looking at his vein map, I do not think he would have adequate vein to use the right great saphenous vein. Therefore, we will likely have to take great saphenous vein from the left side. I have reviewed the indications for lower extremity bypass. I have also reviewed the potential complications of surgery  including but not limited to: wound healing problems, infection, graft thrombosis, limb loss, or other unpredictable medical problems. All the patient's questions were answered and they are agreeable to proceed. Surgery has been scheduled for 06/17/2015. We will stop his Plavix 1 week preoperatively.   Deitra Mayo [facility] [place]: [phone]

## 2015-05-29 ENCOUNTER — Other Ambulatory Visit: Payer: Self-pay

## 2015-06-03 ENCOUNTER — Ambulatory Visit (INDEPENDENT_AMBULATORY_CARE_PROVIDER_SITE_OTHER): Payer: Medicare Other | Admitting: Cardiovascular Disease

## 2015-06-03 ENCOUNTER — Encounter: Payer: Self-pay | Admitting: Cardiovascular Disease

## 2015-06-03 VITALS — BP 146/80 | HR 58 | Ht 68.0 in | Wt 255.0 lb

## 2015-06-03 DIAGNOSIS — I70209 Unspecified atherosclerosis of native arteries of extremities, unspecified extremity: Secondary | ICD-10-CM | POA: Diagnosis not present

## 2015-06-03 DIAGNOSIS — I739 Peripheral vascular disease, unspecified: Secondary | ICD-10-CM | POA: Diagnosis not present

## 2015-06-03 NOTE — Patient Instructions (Addendum)
Medication Instructions:  Your physician recommends that you continue on your current medications as directed. Please refer to the Current Medication list given to you today.  Labwork: none  Testing/Procedures: None  Follow-Up: Your physician wants you to follow-up in: 6 months with Dr. Gwenlyn Found. You will receive a reminder letter in the mail two months in advance. If you don't receive a letter, please call our office to schedule the follow-up appointment.   Any Other Special Instructions Will Be Listed Below (If Applicable).  You are cleared for your upcoming Vascular Surgery at low cardiac risk.   If you need a refill on your cardiac medications before your next appointment, please call your pharmacy.

## 2015-06-03 NOTE — Assessment & Plan Note (Signed)
Jacob Romero recently underwent angiography by myself 04/21/15 because of progressive claudication in his right lower extremity. He is status post remote distal SFA to below the knee tibioperoneal trunk bypass by Dr. Amedeo Plenty. His angiogram showed high-grade subocclusive calcified plaque in the distal right common femoral artery and SFA with an occluded mid right SFA. Interestingly, [name] was patent. I referred him to Dr. Doren Custard for surgical evaluation. This occurred 05/28/15 and he is scheduled for bypass grafting in several weeks which he is cleared for at low cardiovascular risk.

## 2015-06-03 NOTE — Progress Notes (Signed)
06/03/2015 Jacob Romero   [birth year]  [id number]  Primary Physician Charletta Cousin, MD Primary Cardiologist: Lorretta Harp MD [name]   HPI:  The patient is a very pleasant 65 year old severely overweight married Caucasian male father of 2, grandfather of 2 grandchildren who I saw 04/15/15... He is accompanied by his wife [name] and son today.He has a history remarkable for hypertension, hyperlipidemia and known CAD. He does have sleep apnea on CPAP. He had above-to-below the knee popliteal bypass grafting by Dr. [name] after an occluded distal right SFA stent which he is now asymptomatic from and which we get annual Dopplers. I cathed him May 15, 2009 via the right radial approach revealing a high-grade mid RCA stenosis with a totally occluded PLA. There was an anterior takeoff. I attempted recanalization, however, he developed a subarachnoid hemorrhage and the procedure was aborted. Dr. Claiborne Billings brought him back and performed high-speed rotational atherectomy, PCI and stenting of his mid RCA but was unable to recanalize the PLA branch. He is completely asymptomatic with regards to this. He has got an excellent lipid profile for secondary prevention, followed closely by Dr. Micheal Likens. His total cholesterol is 140, LDL of 74 and HDL of 45.  I saw him back in the office 05/05/12 at which time he was currently stable. For the last several months he developed progressive dyspnea on exertion fatigue and lower extreme edema. The dyspnea was similar to his symptoms prior to intervention in the past.I obtained a 2-D echo which showed mild to moderate LV dysfunction with an EF in the 45% range and inferior hypokinesia. A Myoview stress test likewise showed LV dysfunction. Because of this I performed cardiac catheterization on him 11/21/12 revealing a patent mid RCA stent with an occluded posterolateral branch which is chronic. His EF was 60%. He continues to be symptomatic. The monitor showed  sinus rhythm, sinus tach and second degree AV block. I referred him to Dr. Thompson Grayer who adjusted his medications. He discontinued his beta blocker and his Mobic. He performed a cardiopulmonary exercise testing which suggested that his symptoms were related to deconditioning and obesity. The patient was clinically improved. I suggested that he seek the potential to dietitian for weight loss.since I saw him last January 2015 he has lost 25 pounds as a result of diet and exercise. He has clinically improved. He denies chest pain or shortness of breath. He retired in September and his wife [name] retired last week. He does complain of right calf claudication which is lifestyle limiting and which began approximately 6 months ago. Lower extremity arterial Doppler studies performed in our office suggested a patent above to below-the-knee popliteal bypass graft with high grade proximal disease in his common femoral and SFA. He underwent peripheral angiogram by myself 04/21/15 revealing high-grade calcified distal right common femoral artery stenosis in proximal right SFA stenosis with an occluded mid right SFA and a patent distal SFA to below the knee tibial peroneal trunk bypass graft 2 vessel runoff. The anterior tibial was occluded. He saw Dr. Scot Dock at my request several days ago has arranged to perform bypass grafting in several weeks.   Current Outpatient Prescriptions  Medication Sig Dispense Refill  . acetaminophen (TYLENOL) 650 MG CR tablet Take 500 mg by mouth every 8 (eight) hours as needed for pain.     [name] ADVAIR DISKUS 250-50 MCG/DOSE AEPB Inhale 1 puff into the lungs daily.     [name] albuterol (PROVENTIL HFA;VENTOLIN HFA) 108 (90  BASE) MCG/ACT inhaler Inhale 2 puffs into the lungs every 6 (six) hours as needed for wheezing or shortness of breath.     [name] amLODipine (NORVASC) 5 MG tablet Take 5 mg by mouth daily.     [name] aspirin EC 81 MG tablet Take 81 mg by mouth daily.      . Azelastine-Fluticasone  (DYMISTA) 137-50 MCG/ACT SUSP Place 1 spray into the nose daily as needed (seasonal allergies). Reported on 05/28/2015    . canagliflozin (INVOKANA) 300 MG TABS tablet Take 300 mg by mouth daily before breakfast.    . cetirizine (ZYRTEC) 10 MG tablet Take 10 mg by mouth daily as needed for allergies.     [name] clopidogrel (PLAVIX) 75 MG tablet TAKE 1 TABLET BY MOUTH DAILY. 30 tablet 11  . DULoxetine (CYMBALTA) 30 MG capsule Take 90 mg by mouth daily. Take 3 tablets daily.    . Fiber CAPS Take 8 capsules by mouth daily.     . fish oil-omega-3 fatty acids 1000 MG capsule Take 3 g by mouth daily.     [name] ketoconazole (NIZORAL) 2 % cream Apply 1 application topically daily.    [name] LANCETS MICRO THIN 33G MISC by Does not apply route 2 (two) times daily.    [name] losartan-hydrochlorothiazide (HYZAAR) 100-12.5 MG tablet Take 1 tablet by mouth daily.    . Multiple Vitamin (MULTIVITAMIN WITH MINERALS) TABS tablet Take 1 tablet by mouth daily.    . nitroGLYCERIN (NITROSTAT) 0.4 MG SL tablet Place 0.4 mg under the tongue every 5 (five) minutes as needed for chest pain.    . pantoprazole (PROTONIX) 40 MG tablet Take 1 tablet (40 mg total) by mouth daily. 30 tablet 11  . rosuvastatin (CRESTOR) 5 MG tablet Take 5 mg by mouth 2 (two) times a week. 2 times weekly.    [name] testosterone (ANDROGEL) 50 MG/5GM (1%) GEL Place 5 g onto the skin daily.     No current facility-administered medications for this visit.    Allergies  Allergen Reactions  . Statins     Unable to tolerate high dose statins    Social History   Social History  . Marital Status: Married    Spouse Name: N/A  . Number of Children: N/A  . Years of Education: N/A   Occupational History  . Not on file.   Social History Main Topics  . Smoking status: Former Smoker    Types: Cigarettes    Quit date: 06/01/1991  . Smokeless tobacco: Never Used  . Alcohol Use: No  . Drug Use: No  . Sexual Activity: Not on file   Other Topics Concern  . 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  Cardiovascular: negative for chest pain, dyspnea on exertion, edema, orthopnea, palpitations, paroxysmal nocturnal dyspnea or shortness of breath Dermatological: negative for rash Respiratory: negative for cough or wheezing Urologic: negative for hematuria Abdominal: negative for nausea, vomiting, diarrhea, bright red blood per rectum, melena, or hematemesis Neurologic: negative for visual changes, syncope, or dizziness All other systems reviewed and are otherwise negative except as noted above.    Blood pressure 146/80, pulse 58, height 5\' 8"  (1.727 m), weight 255 lb (115.667 kg).  General appearance: alert and no distress Neck: no adenopathy, no carotid bruit, no JVD, supple, symmetrical, trachea midline and thyroid not enlarged, symmetric, no  tenderness/mass/nodules Lungs: clear to auscultation bilaterally Heart: regular rate and rhythm, S1, S2 normal, no murmur, click, rub or gallop Extremities: extremities normal, atraumatic, no cyanosis or edema  EKG not performed today  ASSESSMENT AND PLAN:   PVD (peripheral vascular disease) [facility]) Mr. Schwindt recently underwent angiography by myself 04/21/15 because of progressive claudication in his right lower extremity. He is status post remote distal SFA to below the knee tibioperoneal trunk bypass by Dr. Amedeo Plenty. His angiogram showed high-grade subocclusive calcified plaque in the distal right common femoral artery and SFA with an occluded mid right SFA. Interestingly, [name] was patent. I referred him to Dr. Doren Custard for surgical evaluation. This occurred 05/28/15 and he is scheduled for bypass grafting in several weeks which he is cleared for at low cardiovascular risk.      Lorretta Harp MD FACP,FACC,FAHA,  [facility] 06/03/2015 9:35 AM

## 2015-06-11 ENCOUNTER — Encounter (HOSPITAL_COMMUNITY)
Admission: RE | Admit: 2015-06-11 | Discharge: 2015-06-11 | Disposition: A | Discharge status: Home / Self Care | Payer: Medicare Other | Source: Ambulatory Visit | Attending: Vascular Surgery | Admitting: Vascular Surgery

## 2015-06-11 ENCOUNTER — Encounter (HOSPITAL_COMMUNITY): Payer: Self-pay

## 2015-06-11 DIAGNOSIS — Z7902 Long term (current) use of antithrombotics/antiplatelets: Secondary | ICD-10-CM | POA: Insufficient documentation

## 2015-06-11 DIAGNOSIS — I2510 Atherosclerotic heart disease of native coronary artery without angina pectoris: Secondary | ICD-10-CM | POA: Insufficient documentation

## 2015-06-11 DIAGNOSIS — Z01812 Encounter for preprocedural laboratory examination: Secondary | ICD-10-CM | POA: Insufficient documentation

## 2015-06-11 DIAGNOSIS — Z0183 Encounter for blood typing: Secondary | ICD-10-CM | POA: Diagnosis not present

## 2015-06-11 DIAGNOSIS — Z87891 Personal history of nicotine dependence: Secondary | ICD-10-CM | POA: Diagnosis not present

## 2015-06-11 DIAGNOSIS — G4730 Sleep apnea, unspecified: Secondary | ICD-10-CM | POA: Insufficient documentation

## 2015-06-11 DIAGNOSIS — Z79899 Other long term (current) drug therapy: Secondary | ICD-10-CM | POA: Diagnosis not present

## 2015-06-11 DIAGNOSIS — I10 Essential (primary) hypertension: Secondary | ICD-10-CM | POA: Diagnosis not present

## 2015-06-11 DIAGNOSIS — Z7982 Long term (current) use of aspirin: Secondary | ICD-10-CM | POA: Diagnosis not present

## 2015-06-11 DIAGNOSIS — Z7984 Long term (current) use of oral hypoglycemic drugs: Secondary | ICD-10-CM | POA: Insufficient documentation

## 2015-06-11 DIAGNOSIS — E119 Type 2 diabetes mellitus without complications: Secondary | ICD-10-CM | POA: Diagnosis not present

## 2015-06-11 DIAGNOSIS — K219 Gastro-esophageal reflux disease without esophagitis: Secondary | ICD-10-CM | POA: Insufficient documentation

## 2015-06-11 DIAGNOSIS — I739 Peripheral vascular disease, unspecified: Secondary | ICD-10-CM | POA: Insufficient documentation

## 2015-06-11 DIAGNOSIS — E785 Hyperlipidemia, unspecified: Secondary | ICD-10-CM | POA: Diagnosis not present

## 2015-06-11 HISTORY — DX: Headache: R51

## 2015-06-11 HISTORY — DX: Cerebral infarction, unspecified: I63.9

## 2015-06-11 HISTORY — DX: Headache, unspecified: R51.9

## 2015-06-11 HISTORY — DX: Unspecified osteoarthritis, unspecified site: M19.90

## 2015-06-11 HISTORY — DX: Unspecified asthma, uncomplicated: J45.909

## 2015-06-11 HISTORY — DX: Fatty (change of) liver, not elsewhere classified: K76.0

## 2015-06-11 LAB — CBC
HEMATOCRIT: 50.1 % (ref 39.0–52.0)
HEMOGLOBIN: 15.9 g/dL (ref 13.0–17.0)
MCH: 26.5 pg (ref 26.0–34.0)
MCHC: 31.7 g/dL (ref 30.0–36.0)
MCV: 83.5 fL (ref 78.0–100.0)
PLATELETS: 200 10*3/uL (ref 150–400)
RBC: 6 MIL/uL — AB (ref 4.22–5.81)
RDW: 15.8 % — ABNORMAL HIGH (ref 11.5–15.5)
WBC: 4.9 10*3/uL (ref 4.0–10.5)

## 2015-06-11 LAB — URINALYSIS, ROUTINE W REFLEX MICROSCOPIC
Bilirubin Urine: NEGATIVE
Glucose, UA: NEGATIVE mg/dL
Hgb urine dipstick: NEGATIVE
KETONES UR: NEGATIVE mg/dL
LEUKOCYTES UA: NEGATIVE
NITRITE: NEGATIVE
PH: 6.5 (ref 5.0–8.0)
PROTEIN: NEGATIVE mg/dL
Specific Gravity, Urine: 1.017 (ref 1.005–1.030)

## 2015-06-11 LAB — COMPREHENSIVE METABOLIC PANEL
ALK PHOS: 92 U/L (ref 38–126)
ALT: 36 U/L (ref 17–63)
AST: 46 U/L — AB (ref 15–41)
Albumin: 4.2 g/dL (ref 3.5–5.0)
Anion gap: 11 (ref 5–15)
BUN: 12 mg/dL (ref 6–20)
CALCIUM: 9.7 mg/dL (ref 8.9–10.3)
CHLORIDE: 101 mmol/L (ref 101–111)
CO2: 28 mmol/L (ref 22–32)
CREATININE: 0.82 mg/dL (ref 0.61–1.24)
GFR calc Af Amer: >60 mL/min (ref >60)
Glucose, Bld: 86 mg/dL (ref 65–99)
Potassium: 3.9 mmol/L (ref 3.5–5.1)
Sodium: 140 mmol/L (ref 135–145)
Total Bilirubin: 1.1 mg/dL (ref 0.3–1.2)
Total Protein: 7.5 g/dL (ref 6.5–8.1)

## 2015-06-11 LAB — PROTIME-INR
INR: 1.12 (ref 0.00–1.49)
PROTHROMBIN TIME: 14.6 s (ref 11.6–15.2)

## 2015-06-11 LAB — TYPE AND SCREEN
ABO/RH(D): O POS
Antibody Screen: NEGATIVE

## 2015-06-11 LAB — SURGICAL PCR SCREEN
MRSA, PCR: POSITIVE — AB
STAPHYLOCOCCUS AUREUS: POSITIVE — AB

## 2015-06-11 LAB — APTT: aPTT: 35 seconds (ref 24–37)

## 2015-06-11 LAB — GLUCOSE, CAPILLARY: GLUCOSE-CAPILLARY: 73 mg/dL (ref 65–99)

## 2015-06-11 NOTE — Progress Notes (Addendum)
REQUESTED SLEEP STUDY, EKG, OV FROM DR, BERRY'S OFFICE.  PATIENT STATED HE WAS INSTRUCTED TO CONTINUE ASPIRIN, BUT STOP PLAVIX.  LAST DOSE OF PLAVIX WAS 06/09/15.

## 2015-06-11 NOTE — Pre-Procedure Instructions (Signed)
Jacob Romero  06/11/2015      [facility] ORDER [address], [place] [address] Phone: [phone] Fax: [phone]  [facility] [address] [place] - [address] [place] [address] Phone: [phone] Fax: [phone]    Your procedure is scheduled on  Tuesday  06/17/15  Report to [facility] Admitting at 530 A.M.  Call this number if you have problems the morning of surgery:  [phone]   Remember:  Do not eat food or drink liquids after midnight.  Take these medicines the morning of surgery with A SIP OF WATER  TYLENOL IF NEEDED, ADVAIR, ALBUTEROL, AMLODIPINE (NORVASC), PANTOPRAZOLE (PROTONIX), EYE DROPS    (STOP  MULTIVITAMIN, FISH OIL, ) How to Manage Your Diabetes Before Surgery   Why is it important to control my blood sugar before and after surgery?   Improving blood sugar levels before and after surgery helps healing and can limit problems.  A way of improving blood sugar control is eating a healthy diet by:  - Eating less sugar and carbohydrates  - Increasing activity/exercise  - Talk with your doctor about reaching your blood sugar goals  High blood sugars (greater than 180 mg/dL) can raise your risk of infections and slow down your recovery so you will need to focus on controlling your diabetes during the weeks before surgery.  Make sure that the doctor who takes care of your diabetes knows about your planned surgery including the date and location.  How do I manage my blood sugars before surgery?   Check your blood sugar at least 4 times a day, 2 days before surgery to make sure that they are not too high or low.   Check your blood sugar the morning of your surgery when you wake up and every 2               hours until you get to the Short-Stay unit.  If your blood sugar is less than 70 mg/dL, you will  need to treat for low blood sugar by:  Treat a low blood sugar (less than 70 mg/dL) with 1/2 cup of clear juice (cranberry or apple), 4 glucose tablets, OR glucose gel.  Recheck blood sugar in 15 minutes after treatment (to make sure it is greater than 70 mg/dL).  If blood sugar is not greater than 70 mg/dL on re-check, call [phone] for further instructions.   Report your blood sugar to the Short-Stay nurse when you get to Short-Stay.  References:  [facility] [place] [facility], 2007 "How to Manage your Diabetes Before and After Surgery".  What do I do about my diabetes medications?   Do not take oral diabetes medicines (pills) the morning of surgery.     Do not take other diabetes injectables the day of surgery including Byetta, Victoza, Bydureon, and Trulicity.    If your CBG is greater than 220 mg/dL, you may take 1/2 of your sliding scale (correction) dose of insulin.   For patients with "Insulin Pumps":  Contact your diabetes doctor for specific instructions before surgery.   Decrease basal insulin rates by 20% at midnight the night before surgery.  Note that if your surgery is planned to be longer than 2 hours, your insulin pump will be removed and intravenous (IV) insulin will be started and managed by the nurses and anesthesiologist.  You will be able to restart your insulin pump once you are awake and able to manage it.  Make sure to bring insulin pump supplies to the hospital with you in case your site needs to be changed.         Do not wear jewelry, make-up or nail polish.  Do not wear lotions, powders, or perfumes.  You may wear deodorant.  Do not shave 48 hours prior to surgery.  Men may shave face and neck.  Do not bring valuables to the hospital.  [facility] is not responsible for any belongings or valuables.  Contacts, dentures or bridgework may not be worn into surgery.  Leave your suitcase in the car.  After surgery it may be brought to  your room.  For patients admitted to the hospital, discharge time will be determined by your treatment team.  Patients discharged the day of surgery will not be allowed to drive home.   Name and phone number of your driver:   Special instructions:  SEE PREPARING FOR SURGERY  Please read over the following fact sheets that you were given. Pain Booklet, Coughing and Deep Breathing, Blood Transfusion Information, MRSA Information and Surgical Site Infection Prevention

## 2015-06-11 NOTE — Progress Notes (Signed)
Called Mr Albers and informed him of positive results from the pcr screen. Script called into [employer] .

## 2015-06-12 LAB — HEMOGLOBIN A1C
HEMOGLOBIN A1C: 6.2 % — AB (ref 4.8–5.6)
MEAN PLASMA GLUCOSE: 131 mg/dL

## 2015-06-12 NOTE — Progress Notes (Signed)
Pts wife called this am stating script hadn't been called in. She asked that I call Mupirocin into the [facility]. [phone].I did call this in

## 2015-06-16 ENCOUNTER — Encounter (HOSPITAL_COMMUNITY): Payer: Self-pay | Admitting: Certified Registered Nurse Anesthetist

## 2015-06-16 MED ORDER — SODIUM CHLORIDE 0.9 % IV SOLN: INTRAVENOUS | Status: DC

## 2015-06-16 MED ORDER — DEXTROSE 5 % IV SOLN
1.5000 g | INTRAVENOUS | Status: AC
  Administered 2015-06-17: 1.5 g via INTRAVENOUS
  Filled 2015-06-16: qty 1.5

## 2015-06-17 ENCOUNTER — Inpatient Hospital Stay (HOSPITAL_COMMUNITY)
Admission: RE | Admit: 2015-06-17 | Discharge: 2015-06-19 | DRG: 254 | Disposition: A | Discharge status: Home / Self Care | Payer: Medicare Other | Source: Ambulatory Visit | Attending: Vascular Surgery | Admitting: Vascular Surgery

## 2015-06-17 ENCOUNTER — Encounter (HOSPITAL_COMMUNITY): Payer: Self-pay | Admitting: *Deleted

## 2015-06-17 ENCOUNTER — Encounter (HOSPITAL_COMMUNITY)
Admission: RE | Disposition: A | Discharge status: Home / Self Care | Payer: Self-pay | Source: Ambulatory Visit | Attending: Vascular Surgery

## 2015-06-17 ENCOUNTER — Inpatient Hospital Stay (HOSPITAL_COMMUNITY): Payer: Medicare Other | Admitting: Certified Registered Nurse Anesthetist

## 2015-06-17 DIAGNOSIS — I739 Peripheral vascular disease, unspecified: Secondary | ICD-10-CM | POA: Diagnosis not present

## 2015-06-17 DIAGNOSIS — Z6838 Body mass index (BMI) 38.0-38.9, adult: Secondary | ICD-10-CM

## 2015-06-17 DIAGNOSIS — Z888 Allergy status to other drugs, medicaments and biological substances status: Secondary | ICD-10-CM | POA: Diagnosis not present

## 2015-06-17 DIAGNOSIS — I4430 Unspecified atrioventricular block: Secondary | ICD-10-CM | POA: Diagnosis not present

## 2015-06-17 DIAGNOSIS — I441 Atrioventricular block, second degree: Secondary | ICD-10-CM | POA: Diagnosis present

## 2015-06-17 DIAGNOSIS — Z955 Presence of coronary angioplasty implant and graft: Secondary | ICD-10-CM | POA: Diagnosis not present

## 2015-06-17 DIAGNOSIS — K760 Fatty (change of) liver, not elsewhere classified: Secondary | ICD-10-CM | POA: Diagnosis present

## 2015-06-17 DIAGNOSIS — I70211 Atherosclerosis of native arteries of extremities with intermittent claudication, right leg: Principal | ICD-10-CM | POA: Diagnosis present

## 2015-06-17 DIAGNOSIS — I2582 Chronic total occlusion of coronary artery: Secondary | ICD-10-CM | POA: Diagnosis present

## 2015-06-17 DIAGNOSIS — E1151 Type 2 diabetes mellitus with diabetic peripheral angiopathy without gangrene: Secondary | ICD-10-CM | POA: Diagnosis present

## 2015-06-17 DIAGNOSIS — I4891 Unspecified atrial fibrillation: Secondary | ICD-10-CM | POA: Diagnosis not present

## 2015-06-17 DIAGNOSIS — Z8673 Personal history of transient ischemic attack (TIA), and cerebral infarction without residual deficits: Secondary | ICD-10-CM | POA: Diagnosis not present

## 2015-06-17 DIAGNOSIS — Z87891 Personal history of nicotine dependence: Secondary | ICD-10-CM

## 2015-06-17 DIAGNOSIS — I10 Essential (primary) hypertension: Secondary | ICD-10-CM | POA: Diagnosis not present

## 2015-06-17 DIAGNOSIS — E785 Hyperlipidemia, unspecified: Secondary | ICD-10-CM | POA: Diagnosis present

## 2015-06-17 DIAGNOSIS — Z9889 Other specified postprocedural states: Secondary | ICD-10-CM

## 2015-06-17 DIAGNOSIS — K219 Gastro-esophageal reflux disease without esophagitis: Secondary | ICD-10-CM | POA: Diagnosis present

## 2015-06-17 DIAGNOSIS — G4730 Sleep apnea, unspecified: Secondary | ICD-10-CM | POA: Diagnosis present

## 2015-06-17 DIAGNOSIS — Z7984 Long term (current) use of oral hypoglycemic drugs: Secondary | ICD-10-CM

## 2015-06-17 DIAGNOSIS — J42 Unspecified chronic bronchitis: Secondary | ICD-10-CM | POA: Diagnosis present

## 2015-06-17 DIAGNOSIS — I2510 Atherosclerotic heart disease of native coronary artery without angina pectoris: Secondary | ICD-10-CM | POA: Diagnosis present

## 2015-06-17 HISTORY — PX: ENDARTERECTOMY FEMORAL: SHX5804

## 2015-06-17 HISTORY — PX: VEIN HARVEST: SHX6363

## 2015-06-17 HISTORY — PX: FEMORAL-POPLITEAL BYPASS GRAFT: SHX937

## 2015-06-17 LAB — GLUCOSE, CAPILLARY
Glucose-Capillary: 116 mg/dL — ABNORMAL HIGH (ref 65–99)
Glucose-Capillary: 120 mg/dL — ABNORMAL HIGH (ref 65–99)

## 2015-06-17 SURGERY — BYPASS GRAFT FEMORAL-POPLITEAL ARTERY
Anesthesia: General | Site: Leg Upper | Laterality: Right

## 2015-06-17 MED ORDER — HEPARIN SODIUM (PORCINE) 1000 UNIT/ML IJ SOLN
INTRAMUSCULAR | Status: DC | PRN
  Administered 2015-06-17: 10000 [IU] via INTRAVENOUS

## 2015-06-17 MED ORDER — LOSARTAN POTASSIUM 50 MG PO TABS
100.0000 mg | ORAL_TABLET | Freq: Every day | ORAL | Status: DC
  Administered 2015-06-18 – 2015-06-19 (×2): 100 mg via ORAL
  Filled 2015-06-17 (×2): qty 2

## 2015-06-17 MED ORDER — HYDROMORPHONE HCL 1 MG/ML IJ SOLN
0.2500 mg | INTRAMUSCULAR | Status: DC | PRN
  Administered 2015-06-17 (×4): 0.5 mg via INTRAVENOUS

## 2015-06-17 MED ORDER — ONDANSETRON HCL 4 MG/2ML IJ SOLN
INTRAMUSCULAR | Status: DC | PRN
  Administered 2015-06-17: 4 mg via INTRAVENOUS

## 2015-06-17 MED ORDER — LIDOCAINE HCL (CARDIAC) 20 MG/ML IV SOLN
INTRAVENOUS | Status: AC
  Filled 2015-06-17: qty 5

## 2015-06-17 MED ORDER — POTASSIUM CHLORIDE CRYS ER 20 MEQ PO TBCR: 20.0000 meq | EXTENDED_RELEASE_TABLET | Freq: Every day | ORAL | Status: DC | PRN

## 2015-06-17 MED ORDER — ASPIRIN EC 81 MG PO TBEC
81.0000 mg | DELAYED_RELEASE_TABLET | Freq: Every day | ORAL | Status: DC
  Administered 2015-06-17 – 2015-06-19 (×3): 81 mg via ORAL
  Filled 2015-06-17 (×3): qty 1

## 2015-06-17 MED ORDER — ONDANSETRON HCL 4 MG/2ML IJ SOLN: 4.0000 mg | Freq: Four times a day (QID) | INTRAMUSCULAR | Status: DC | PRN

## 2015-06-17 MED ORDER — PHENYLEPHRINE HCL 10 MG/ML IJ SOLN
INTRAMUSCULAR | Status: DC | PRN
  Administered 2015-06-17 (×2): 80 ug via INTRAVENOUS

## 2015-06-17 MED ORDER — ONDANSETRON HCL 4 MG/2ML IJ SOLN
INTRAMUSCULAR | Status: AC
  Filled 2015-06-17: qty 2

## 2015-06-17 MED ORDER — SODIUM CHLORIDE 0.9 % IV SOLN: 500.0000 mL | Freq: Once | INTRAVENOUS | Status: DC | PRN

## 2015-06-17 MED ORDER — SUGAMMADEX SODIUM 500 MG/5ML IV SOLN
INTRAVENOUS | Status: AC
  Filled 2015-06-17: qty 5

## 2015-06-17 MED ORDER — MORPHINE SULFATE (PF) 2 MG/ML IV SOLN
INTRAVENOUS | Status: AC
  Filled 2015-06-17: qty 1

## 2015-06-17 MED ORDER — SENNOSIDES-DOCUSATE SODIUM 8.6-50 MG PO TABS
1.0000 | ORAL_TABLET | Freq: Every evening | ORAL | Status: DC | PRN
Start: 2015-06-17 — End: 2015-06-19

## 2015-06-17 MED ORDER — MORPHINE SULFATE (PF) 2 MG/ML IV SOLN
2.0000 mg | INTRAVENOUS | Status: DC | PRN
  Administered 2015-06-17 (×3): 2 mg via INTRAVENOUS
  Administered 2015-06-17 – 2015-06-19 (×6): 4 mg via INTRAVENOUS
  Filled 2015-06-17: qty 2
  Filled 2015-06-17: qty 1
  Filled 2015-06-17: qty 2
  Filled 2015-06-17: qty 1
  Filled 2015-06-17 (×4): qty 2

## 2015-06-17 MED ORDER — DULOXETINE HCL 60 MG PO CPEP
90.0000 mg | ORAL_CAPSULE | Freq: Every day | ORAL | Status: DC
  Administered 2015-06-17 – 2015-06-18 (×2): 90 mg via ORAL
  Filled 2015-06-17 (×2): qty 1

## 2015-06-17 MED ORDER — METOPROLOL TARTRATE 1 MG/ML IV SOLN: 2.0000 mg | INTRAVENOUS | Status: DC | PRN

## 2015-06-17 MED ORDER — LACTATED RINGERS IV SOLN
INTRAVENOUS | Status: DC | PRN
  Administered 2015-06-17 (×2): via INTRAVENOUS

## 2015-06-17 MED ORDER — GUAIFENESIN-DM 100-10 MG/5ML PO SYRP: 15.0000 mL | ORAL_SOLUTION | ORAL | Status: DC | PRN

## 2015-06-17 MED ORDER — PANTOPRAZOLE SODIUM 40 MG PO TBEC
40.0000 mg | DELAYED_RELEASE_TABLET | Freq: Every day | ORAL | Status: DC
  Administered 2015-06-18 – 2015-06-19 (×2): 40 mg via ORAL
  Filled 2015-06-17 (×2): qty 1

## 2015-06-17 MED ORDER — 0.9 % SODIUM CHLORIDE (POUR BTL) OPTIME
TOPICAL | Status: DC | PRN
  Administered 2015-06-17: 2000 mL

## 2015-06-17 MED ORDER — AZELASTINE-FLUTICASONE 137-50 MCG/ACT NA SUSP: 1.0000 | Freq: Every day | NASAL | Status: DC | PRN

## 2015-06-17 MED ORDER — ALUM & MAG HYDROXIDE-SIMETH 200-200-20 MG/5ML PO SUSP: 15.0000 mL | ORAL | Status: DC | PRN

## 2015-06-17 MED ORDER — LIDOCAINE HCL (CARDIAC) 20 MG/ML IV SOLN
INTRAVENOUS | Status: DC | PRN
  Administered 2015-06-17: 100 mg via INTRAVENOUS

## 2015-06-17 MED ORDER — LOSARTAN POTASSIUM-HCTZ 100-12.5 MG PO TABS: 1.0000 | ORAL_TABLET | Freq: Every day | ORAL | Status: DC

## 2015-06-17 MED ORDER — ALBUTEROL SULFATE (2.5 MG/3ML) 0.083% IN NEBU: 2.5000 mg | INHALATION_SOLUTION | Freq: Four times a day (QID) | RESPIRATORY_TRACT | Status: DC | PRN

## 2015-06-17 MED ORDER — ROCURONIUM BROMIDE 100 MG/10ML IV SOLN
INTRAVENOUS | Status: DC | PRN
  Administered 2015-06-17: 50 mg via INTRAVENOUS

## 2015-06-17 MED ORDER — CHLORHEXIDINE GLUCONATE CLOTH 2 % EX PADS
6.0000 | MEDICATED_PAD | Freq: Every day | CUTANEOUS | Status: DC
  Administered 2015-06-18 – 2015-06-19 (×2): 6 via TOPICAL

## 2015-06-17 MED ORDER — HYDRALAZINE HCL 20 MG/ML IJ SOLN: 5.0000 mg | INTRAMUSCULAR | Status: DC | PRN

## 2015-06-17 MED ORDER — MIDAZOLAM HCL 5 MG/5ML IJ SOLN
INTRAMUSCULAR | Status: DC | PRN
  Administered 2015-06-17: 2 mg via INTRAVENOUS

## 2015-06-17 MED ORDER — HYDROCHLOROTHIAZIDE 12.5 MG PO CAPS
12.5000 mg | ORAL_CAPSULE | Freq: Every day | ORAL | Status: DC
  Administered 2015-06-18 – 2015-06-19 (×2): 12.5 mg via ORAL
  Filled 2015-06-17 (×2): qty 1

## 2015-06-17 MED ORDER — LABETALOL HCL 5 MG/ML IV SOLN: 10.0000 mg | INTRAVENOUS | Status: DC | PRN

## 2015-06-17 MED ORDER — ENOXAPARIN SODIUM 40 MG/0.4ML ~~LOC~~ SOLN
40.0000 mg | SUBCUTANEOUS | Status: DC
  Administered 2015-06-18 – 2015-06-19 (×2): 40 mg via SUBCUTANEOUS
  Filled 2015-06-17 (×2): qty 0.4

## 2015-06-17 MED ORDER — OXYCODONE-ACETAMINOPHEN 5-325 MG PO TABS
1.0000 | ORAL_TABLET | ORAL | Status: DC | PRN
  Administered 2015-06-17 – 2015-06-19 (×8): 2 via ORAL
  Filled 2015-06-17 (×7): qty 2

## 2015-06-17 MED ORDER — ALBUTEROL SULFATE HFA 108 (90 BASE) MCG/ACT IN AERS: 2.0000 | INHALATION_SPRAY | Freq: Four times a day (QID) | RESPIRATORY_TRACT | Status: DC | PRN

## 2015-06-17 MED ORDER — PHENYLEPHRINE HCL 10 MG/ML IJ SOLN
10.0000 mg | INTRAVENOUS | Status: DC | PRN
  Administered 2015-06-17: 15 ug/min via INTRAVENOUS

## 2015-06-17 MED ORDER — ADULT MULTIVITAMIN W/MINERALS CH
1.0000 | ORAL_TABLET | Freq: Every day | ORAL | Status: DC
  Administered 2015-06-18 – 2015-06-19 (×2): 1 via ORAL
  Filled 2015-06-17 (×2): qty 1

## 2015-06-17 MED ORDER — SUCCINYLCHOLINE CHLORIDE 20 MG/ML IJ SOLN
INTRAMUSCULAR | Status: DC | PRN
  Administered 2015-06-17: 120 mg via INTRAVENOUS

## 2015-06-17 MED ORDER — PROPOFOL 10 MG/ML IV BOLUS
INTRAVENOUS | Status: DC | PRN
  Administered 2015-06-17: 200 mg via INTRAVENOUS

## 2015-06-17 MED ORDER — NITROGLYCERIN 0.4 MG SL SUBL: 0.4000 mg | SUBLINGUAL_TABLET | SUBLINGUAL | Status: DC | PRN

## 2015-06-17 MED ORDER — INSULIN ASPART 100 UNIT/ML ~~LOC~~ SOLN: 0.0000 [IU] | Freq: Three times a day (TID) | SUBCUTANEOUS | Status: DC

## 2015-06-17 MED ORDER — HYDROMORPHONE HCL 1 MG/ML IJ SOLN
INTRAMUSCULAR | Status: AC
  Filled 2015-06-17: qty 1

## 2015-06-17 MED ORDER — ACETAMINOPHEN 325 MG PO TABS: 325.0000 mg | ORAL_TABLET | ORAL | Status: DC | PRN

## 2015-06-17 MED ORDER — PAPAVERINE HCL 30 MG/ML IJ SOLN
INTRAMUSCULAR | Status: AC
  Filled 2015-06-17: qty 2

## 2015-06-17 MED ORDER — ACETAMINOPHEN 325 MG RE SUPP
325.0000 mg | RECTAL | Status: DC | PRN
  Filled 2015-06-17: qty 2

## 2015-06-17 MED ORDER — SODIUM CHLORIDE 0.9 % IV SOLN
INTRAVENOUS | Status: DC
  Administered 2015-06-18: 10:00:00 via INTRAVENOUS
  Administered 2015-06-18: 1000 mL via INTRAVENOUS

## 2015-06-17 MED ORDER — VECURONIUM BROMIDE 10 MG IV SOLR
INTRAVENOUS | Status: DC | PRN
  Administered 2015-06-17 (×4): 2 mg via INTRAVENOUS

## 2015-06-17 MED ORDER — CHLORHEXIDINE GLUCONATE CLOTH 2 % EX PADS: 6.0000 | MEDICATED_PAD | Freq: Once | CUTANEOUS | Status: DC

## 2015-06-17 MED ORDER — PROMETHAZINE HCL 25 MG/ML IJ SOLN: 6.2500 mg | INTRAMUSCULAR | Status: DC | PRN

## 2015-06-17 MED ORDER — DOCUSATE SODIUM 100 MG PO CAPS
100.0000 mg | ORAL_CAPSULE | Freq: Every day | ORAL | Status: DC
  Administered 2015-06-18 – 2015-06-19 (×2): 100 mg via ORAL
  Filled 2015-06-17 (×2): qty 1

## 2015-06-17 MED ORDER — CLOPIDOGREL BISULFATE 75 MG PO TABS
75.0000 mg | ORAL_TABLET | Freq: Every day | ORAL | Status: DC
  Administered 2015-06-18 – 2015-06-19 (×2): 75 mg via ORAL
  Filled 2015-06-17 (×2): qty 1

## 2015-06-17 MED ORDER — CEFUROXIME SODIUM 1.5 G IJ SOLR
1.5000 g | Freq: Two times a day (BID) | INTRAMUSCULAR | Status: AC
  Administered 2015-06-17 – 2015-06-18 (×2): 1.5 g via INTRAVENOUS
  Filled 2015-06-17 (×2): qty 1.5

## 2015-06-17 MED ORDER — METFORMIN HCL ER 500 MG PO TB24
1000.0000 mg | ORAL_TABLET | Freq: Every day | ORAL | Status: DC
  Administered 2015-06-18 – 2015-06-19 (×2): 1000 mg via ORAL
  Filled 2015-06-17 (×2): qty 2

## 2015-06-17 MED ORDER — FENTANYL CITRATE (PF) 100 MCG/2ML IJ SOLN
INTRAMUSCULAR | Status: DC | PRN
  Administered 2015-06-17 (×2): 50 ug via INTRAVENOUS
  Administered 2015-06-17 (×2): 25 ug via INTRAVENOUS
  Administered 2015-06-17 (×2): 50 ug via INTRAVENOUS

## 2015-06-17 MED ORDER — SODIUM CHLORIDE 0.9 % IV SOLN
INTRAVENOUS | Status: DC | PRN
  Administered 2015-06-17: 09:00:00

## 2015-06-17 MED ORDER — AMLODIPINE BESYLATE 5 MG PO TABS
5.0000 mg | ORAL_TABLET | Freq: Every day | ORAL | Status: DC
  Administered 2015-06-18 – 2015-06-19 (×2): 5 mg via ORAL
  Filled 2015-06-17 (×2): qty 1

## 2015-06-17 MED ORDER — PROTAMINE SULFATE 10 MG/ML IV SOLN
INTRAVENOUS | Status: DC | PRN
  Administered 2015-06-17: 40 mg via INTRAVENOUS

## 2015-06-17 MED ORDER — MAGNESIUM SULFATE 2 GM/50ML IV SOLN
2.0000 g | Freq: Every day | INTRAVENOUS | Status: DC | PRN
  Filled 2015-06-17: qty 50

## 2015-06-17 MED ORDER — ROSUVASTATIN CALCIUM 10 MG PO TABS: 5.0000 mg | ORAL_TABLET | ORAL | Status: DC

## 2015-06-17 MED ORDER — PHENOL 1.4 % MT LIQD: 1.0000 | OROMUCOSAL | Status: DC | PRN

## 2015-06-17 MED ORDER — MUPIROCIN 2 % EX OINT
TOPICAL_OINTMENT | CUTANEOUS | Status: AC
  Administered 2015-06-18: 04:00:00
  Filled 2015-06-17: qty 22

## 2015-06-17 MED ORDER — OXYCODONE-ACETAMINOPHEN 5-325 MG PO TABS
ORAL_TABLET | ORAL | Status: AC
  Filled 2015-06-17: qty 2

## 2015-06-17 MED ORDER — EPHEDRINE SULFATE 50 MG/ML IJ SOLN
INTRAMUSCULAR | Status: DC | PRN
  Administered 2015-06-17: 10 mg via INTRAVENOUS
  Administered 2015-06-17 (×4): 5 mg via INTRAVENOUS

## 2015-06-17 MED ORDER — FENTANYL CITRATE (PF) 250 MCG/5ML IJ SOLN
INTRAMUSCULAR | Status: AC
  Filled 2015-06-17: qty 5

## 2015-06-17 MED ORDER — GLYCOPYRROLATE 0.2 MG/ML IJ SOLN
INTRAMUSCULAR | Status: DC | PRN
Start: 2015-06-17 — End: 2015-06-17
  Administered 2015-06-17: 0.2 mg via INTRAVENOUS

## 2015-06-17 MED ORDER — TESTOSTERONE 50 MG/5GM (1%) TD GEL
5.0000 g | Freq: Every day | TRANSDERMAL | Status: DC
  Administered 2015-06-18 – 2015-06-19 (×2): 5 g via TRANSDERMAL
  Filled 2015-06-17 (×2): qty 5

## 2015-06-17 MED ORDER — BISACODYL 10 MG RE SUPP: 10.0000 mg | Freq: Every day | RECTAL | Status: DC | PRN

## 2015-06-17 MED ORDER — PAPAVERINE HCL 30 MG/ML IJ SOLN
INTRAMUSCULAR | Status: DC | PRN
  Administered 2015-06-17 (×2): 60 mg

## 2015-06-17 MED ORDER — MIDAZOLAM HCL 2 MG/2ML IJ SOLN
INTRAMUSCULAR | Status: AC
  Filled 2015-06-17: qty 2

## 2015-06-17 MED ORDER — ROCURONIUM BROMIDE 50 MG/5ML IV SOLN
INTRAVENOUS | Status: AC
  Filled 2015-06-17: qty 1

## 2015-06-17 MED ORDER — MUPIROCIN 2 % EX OINT
1.0000 | TOPICAL_OINTMENT | Freq: Two times a day (BID) | CUTANEOUS | Status: DC
Start: 2015-06-17 — End: 2015-06-19
  Administered 2015-06-17 – 2015-06-19 (×5): 1 via NASAL

## 2015-06-17 MED ORDER — PROPOFOL 10 MG/ML IV BOLUS
INTRAVENOUS | Status: AC
  Filled 2015-06-17: qty 40

## 2015-06-17 MED ORDER — SUGAMMADEX SODIUM 200 MG/2ML IV SOLN
INTRAVENOUS | Status: DC | PRN
  Administered 2015-06-17: 200 mg via INTRAVENOUS

## 2015-06-17 MED ORDER — TRAMADOL HCL 50 MG PO TABS: 50.0000 mg | ORAL_TABLET | Freq: Two times a day (BID) | ORAL | Status: DC | PRN

## 2015-06-17 SURGICAL SUPPLY — 62 items
BANDAGE ESMARK 6X9 LF (GAUZE/BANDAGES/DRESSINGS) IMPLANT
BNDG CMPR 9X6 STRL LF SNTH (GAUZE/BANDAGES/DRESSINGS) ×3
BNDG ESMARK 6X9 LF (GAUZE/BANDAGES/DRESSINGS) ×5
CANISTER SUCTION 2500CC (MISCELLANEOUS) ×5 IMPLANT
CANNULA VESSEL 3MM 2 BLNT TIP (CANNULA) ×12 IMPLANT
CLIP TI MEDIUM 24 (CLIP) ×5 IMPLANT
CLIP TI WIDE RED SMALL 24 (CLIP) ×7 IMPLANT
CUFF TOURNIQUET SINGLE 24IN (TOURNIQUET CUFF) IMPLANT
CUFF TOURNIQUET SINGLE 34IN LL (TOURNIQUET CUFF) IMPLANT
CUFF TOURNIQUET SINGLE 44IN (TOURNIQUET CUFF) IMPLANT
DRAIN CHANNEL 15F RND FF W/TCR (WOUND CARE) IMPLANT
DRAPE X-RAY CASS 24X20 (DRAPES) IMPLANT
ELECT REM PT RETURN 9FT ADLT (ELECTROSURGICAL) ×10
ELECTRODE REM PT RTRN 9FT ADLT (ELECTROSURGICAL) ×3 IMPLANT
EVACUATOR SILICONE 100CC (DRAIN) IMPLANT
GLOVE BIO SURGEON STRL SZ 6.5 (GLOVE) ×1 IMPLANT
GLOVE BIO SURGEON STRL SZ7.5 (GLOVE) ×5 IMPLANT
GLOVE BIO SURGEONS STRL SZ 6.5 (GLOVE) ×1
GLOVE BIOGEL PI IND STRL 6.5 (GLOVE) IMPLANT
GLOVE BIOGEL PI IND STRL 7.0 (GLOVE) IMPLANT
GLOVE BIOGEL PI IND STRL 7.5 (GLOVE) IMPLANT
GLOVE BIOGEL PI IND STRL 8 (GLOVE) ×3 IMPLANT
GLOVE BIOGEL PI INDICATOR 6.5 (GLOVE) ×2
GLOVE BIOGEL PI INDICATOR 7.0 (GLOVE) ×8
GLOVE BIOGEL PI INDICATOR 7.5 (GLOVE) ×2
GLOVE BIOGEL PI INDICATOR 8 (GLOVE) ×2
GLOVE ECLIPSE 7.0 STRL STRAW (GLOVE) ×2 IMPLANT
GLOVE SURG SS PI 6.5 STRL IVOR (GLOVE) ×4 IMPLANT
GOWN STRL REUS W/ TWL LRG LVL3 (GOWN DISPOSABLE) ×9 IMPLANT
GOWN STRL REUS W/TWL LRG LVL3 (GOWN DISPOSABLE) ×25
KIT BASIN OR (CUSTOM PROCEDURE TRAY) ×5 IMPLANT
KIT ROOM TURNOVER OR (KITS) ×5 IMPLANT
LIQUID BAND (GAUZE/BANDAGES/DRESSINGS) ×8 IMPLANT
MARKER GRAFT CORONARY BYPASS (MISCELLANEOUS) ×2 IMPLANT
NDL 18GX1X1/2 (RX/OR ONLY) (NEEDLE) IMPLANT
NEEDLE 18GX1X1/2 (RX/OR ONLY) (NEEDLE) ×5 IMPLANT
NS IRRIG 1000ML POUR BTL (IV SOLUTION) ×10 IMPLANT
PACK PERIPHERAL VASCULAR (CUSTOM PROCEDURE TRAY) ×5 IMPLANT
PAD ARMBOARD 7.5X6 YLW CONV (MISCELLANEOUS) ×10 IMPLANT
SET COLLECT BLD 21X3/4 12 (NEEDLE) IMPLANT
SPONGE INTESTINAL PEANUT (DISPOSABLE) ×2 IMPLANT
SPONGE LAP 18X18 X RAY DECT (DISPOSABLE) ×2 IMPLANT
STOPCOCK 4 WAY LG BORE MALE ST (IV SETS) IMPLANT
SUT PROLENE 5 0 C 1 24 (SUTURE) ×9 IMPLANT
SUT PROLENE 6 0 BV (SUTURE) ×13 IMPLANT
SUT PROLENE 7 0 BV 1 (SUTURE) IMPLANT
SUT SILK 2 0 (SUTURE) ×5
SUT SILK 2 0 FS (SUTURE) ×5 IMPLANT
SUT SILK 2 0 SH (SUTURE) ×3 IMPLANT
SUT SILK 2-0 18XBRD TIE 12 (SUTURE) IMPLANT
SUT SILK 3 0 (SUTURE) ×5
SUT SILK 3-0 18XBRD TIE 12 (SUTURE) IMPLANT
SUT VIC AB 2-0 CTB1 (SUTURE) ×12 IMPLANT
SUT VIC AB 3-0 SH 27 (SUTURE) ×30
SUT VIC AB 3-0 SH 27X BRD (SUTURE) ×6 IMPLANT
SUT VICRYL 4-0 PS2 18IN ABS (SUTURE) ×16 IMPLANT
SYR 5ML LL (SYRINGE) ×2 IMPLANT
TAPE UMBILICAL COTTON 1/8X30 (MISCELLANEOUS) ×2 IMPLANT
TRAY FOLEY W/METER SILVER 16FR (SET/KITS/TRAYS/PACK) ×5 IMPLANT
TUBING EXTENTION W/L.L. (IV SETS) IMPLANT
UNDERPAD 30X30 INCONTINENT (UNDERPADS AND DIAPERS) ×5 IMPLANT
WATER STERILE IRR 1000ML POUR (IV SOLUTION) ×5 IMPLANT

## 2015-06-17 NOTE — Progress Notes (Signed)
Pt c/o severe pain in crook of right elbow, says he was working out with weights yesterday. RUE warm, cap refill < 3 sec, 2+ r rad pulse. Lennie Muckle PA fully updated. Said to apply ice pack and use pain med. She is also aware pt in 2nd degree HB, which is not new. Will cont to monitor.

## 2015-06-17 NOTE — Op Note (Signed)
NAME: Jacob Romero   MRN: [id number] DOB: Sep 30, 1950    DATE OF OPERATION: 06/17/2015  PREOP DIAGNOSIS: right lower extremity claudication with threatened femoropopliteal graft failure  POSTOP DIAGNOSIS: same  PROCEDURE:  1. Redo right femoropopliteal bypass graft with left great saphenous vein  SURGEON: Judeth Cornfield. Scot Dock, MD, FACS  ASSIST: Gerri Lins PA  ANESTHESIA: Gen.   EBL: minimal  INDICATIONS: Jacob Romero is a 65 y.o. male who underwent a previous right above-knee to below-knee popliteal artery bypass in the remote past. He underwent an arteriogram by [name] which showed that his superficial femoral artery above the graft was occluded. He had a calcified eccentric plaque in the right common femoral artery. Surprising the graft had remained patent below the superficial femoral artery occlusion. Given the risk for graft thrombosis he presents for redo femoropopliteal bypass grafting  FINDINGS: Extensive femoral endarterectomy was performed. A long proximal anastomosis was used to patch the artery. The distal anastomosis was to the old femoropopliteal bypass graft end-to-end.   TECHNIQUE: The patient was taken to the operating room and received a general anesthetic. Both lower extremities were prepped and draped in usual sterile fashion. A longitudinal incision was made in the right groin. The common femoral, superficial femoral, and deep femoral arteries were dissected free. There was a bulky calcific plaque in the common femoral artery. I had to dissected well into the inguinal ligament in order to get proximal control. A posterior branch and 2 side branches were also controlled.  A separate longitudinal incision was made on the medial aspect of the distal right thigh. Here the old above-knee to below-knee femoropopliteal bypass graft was dissected free and was patent. It was a vein graft. The vein was soft and free of disease. A tunnel was graded between  the 2 incisions.  In the left leg using 4 incisions along the medial aspect of the left leg great saphenous vein was harvested from the saphenofemoral junction to below the knee. Branches were divided between clips and 3-0 silk ties. The vein was ligated distally and irrigated with heparinized saline. The saphenofemoral junction was clamped after the patient was heparinized. The saphenous vein was excised from the femoral vein and this was oversewn with a running 5-0 Prolene suture. The proximal valve in the vein was sharply excised.  The patient had been heparinized. The external iliac artery was clamped and the superficial femoral and deep femoral arteries were controlled. A longitudinal arteriotomy was made in the common femoral artery extending down onto the superficial femoral artery. This was extended proximally as there was an extensive bulky plaque within the common femoral artery. This was endarterectomized down to the level of the superficial femoral artery which was chronically occluded. All loose debris was removed and the artery was irrigated with copious amounts of heparinized saline.  Next the vein was used in a non-reverse fashion. It was widely spatulated so they could be used as a patch for the arteriotomy in the common femoral artery. Of note I did place a radial opaque marker around the proximal anastomosis.This was sewn end-to-side to the artery using continuous 5-0 Prolene suture. Prior to completing this closure the artery was flushed and it was excellent inflow. The anastomosis was completed. Next using a retrograde Mills valvulotome the valves were sharply excised. The vein was brought through the previous created tunnel after it was marked to prevent twisting. The distal vein graft was clamped with a Serafin clamp and the proximal  vein from the old bypass was ligated. This vein was then spatulated. The new segment of vein graft was spatulated and sewn end to end to the old bypass  graft. At the completion was an excellent signal in the graft and also a good posterior tibial and anterior tibial signal with the Doppler.  Hemostasis was obtained in the wounds in the left leg. Each of these wounds was closed the deep layer 3-0 Vicryl and the skin closed with 4-0 Vicryl. In the right groin the wound was irrigated and hemostasis obtained. This wound was closed with a deep layer of 2-0 Vicryl, a subcutaneous layer of 3-0 Vicryl and the skin closed with a 4-0 Vicryl. The distal left leg incision was closed with 2 deep layers of 3-0 Vicryl and the skin closed with 4-0 Vicryl.  Liquid and was applied. The patient tolerated the procedure well and transferred to the recovery in stable condition. All needle and sponge counts were correct.  Deitra Mayo, MD, FACS [facility]  DATE OF DICTATION:   06/17/2015

## 2015-06-17 NOTE — Transfer of Care (Signed)
Immediate Anesthesia Transfer of Care Note  Patient: Jacob Romero  Procedure(s) Performed: Procedure(s): REDO BYPASS GRAFT RIGHT FEMORAL TO ABOVE KNEE POPLITEAL ARTERY USING LEFT NON-REVERSED GREATER SAPHENOUS VEIN (Right) VEIN HARVEST LEFT GREATER SAPHENOUS VEIN (Left) ENDARTERECTOMY RIGHT COMMON FEMORAL ARTERY (Right)  Patient Location: PACU  Anesthesia Type:General  Level of Consciousness: awake, alert  and oriented  Airway & Oxygen Therapy: Patient connected to face mask oxygen  Post-op Assessment: Report given to RN  Post vital signs: stable  Last Vitals:  Filed Vitals:   06/17/15 0616  BP: 134/72  Pulse: 63  Temp: 36.8 C  Resp: 18    Complications: No apparent anesthesia complications

## 2015-06-17 NOTE — Progress Notes (Signed)
Dr Scot Dock here to see pt-aware of RUE pain, 2nd degree HB. No new orders. Will cont to monitor.

## 2015-06-17 NOTE — Progress Notes (Signed)
Lunch relief by S. Gregson RN 

## 2015-06-17 NOTE — Anesthesia Postprocedure Evaluation (Signed)
Anesthesia Post Note  Patient: Jacob Romero  Procedure(s) Performed: Procedure(s) (LRB): REDO BYPASS GRAFT RIGHT FEMORAL TO ABOVE KNEE POPLITEAL ARTERY USING LEFT NON-REVERSED GREATER SAPHENOUS VEIN (Right) VEIN HARVEST LEFT GREATER SAPHENOUS VEIN (Left) ENDARTERECTOMY RIGHT COMMON FEMORAL ARTERY (Right)  Patient location during evaluation: PACU Anesthesia Type: General Level of consciousness: awake and alert Pain management: pain level controlled Vital Signs Assessment: post-procedure vital signs reviewed and stable Respiratory status: spontaneous breathing, nonlabored ventilation, respiratory function stable and patient connected to nasal cannula oxygen Cardiovascular status: blood pressure returned to baseline and stable Postop Assessment: no signs of nausea or vomiting Anesthetic complications: no    Last Vitals:  Filed Vitals:   06/17/15 1522 06/17/15 1530  BP: 133/64   Pulse:  63  Temp:    Resp:  12    Last Pain:  Filed Vitals:   06/17/15 1534  PainSc: 5                  Kollyn Lingafelter,JAMES TERRILL

## 2015-06-17 NOTE — Anesthesia Procedure Notes (Signed)
Procedure Name: Intubation Date/Time: 06/17/2015 7:43 AM Performed by: Clearnce Sorrel Pre-anesthesia Checklist: Patient identified, Timeout performed, Emergency Drugs available, Suction available and Patient being monitored Patient Re-evaluated:Patient Re-evaluated prior to inductionOxygen Delivery Method: Circle system utilized Preoxygenation: Pre-oxygenation with 100% oxygen Intubation Type: IV induction Ventilation: Mask ventilation without difficulty Laryngoscope Size: Mac and 3 Grade View: Grade II Tube type: Oral Tube size: 7.5 mm Number of attempts: 1 Airway Equipment and Method: Stylet Placement Confirmation: ETT inserted through vocal cords under direct vision,  breath sounds checked- equal and bilateral and positive ETCO2 Secured at: 23 cm Tube secured with: Tape Dental Injury: Teeth and Oropharynx as per pre-operative assessment

## 2015-06-17 NOTE — Interval H&P Note (Signed)
History and Physical Interval Note:  06/17/2015 6:52 AM  Jacob Romero  has presented today for surgery, with the diagnosis of Peripheral vascular disease with right lower extremity claudication I70.211  The various methods of treatment have been discussed with the patient and family. After consideration of risks, benefits and other options for treatment, the patient has consented to  Procedure(s): REDO BYPASS GRAFT FEMORAL-POPLITEAL ARTERY (Right) VEIN HARVEST (Left) as a surgical intervention .  The patient's history has been reviewed, patient examined, no change in status, stable for surgery.  I have reviewed the patient's chart and labs.  Questions were answered to the patient's satisfaction.     Deitra Mayo

## 2015-06-17 NOTE — Anesthesia Preprocedure Evaluation (Addendum)
Anesthesia Evaluation  Patient identified by MRN, date of birth, ID band Patient awake    Reviewed: Allergy & Precautions, NPO status , Patient's Chart, lab work & pertinent test results  History of Anesthesia Complications Negative for: history of anesthetic complications  Airway Mallampati: II  TM Distance: >3 FB Neck ROM: Full    Dental  (+) Teeth Intact   Pulmonary shortness of breath, sleep apnea , former smoker,    breath sounds clear to auscultation       Cardiovascular hypertension, + CAD and + Peripheral Vascular Disease  + dysrhythmias  Rhythm:Regular Rate:Normal     Neuro/Psych    GI/Hepatic hiatal hernia, GERD  ,  Endo/Other  Morbid obesity  Renal/GU      Musculoskeletal  (+) Arthritis ,   Abdominal (+) + obese,   Peds  Hematology   Anesthesia Other Findings   Reproductive/Obstetrics                            Anesthesia Physical Anesthesia Plan  ASA: III  Anesthesia Plan: General   Post-op Pain Management:    Induction: Intravenous  Airway Management Planned: Oral ETT  Additional Equipment:   Intra-op Plan:   Post-operative Plan: Extubation in OR and Possible Post-op intubation/ventilation  Informed Consent: I have reviewed the patients History and Physical, chart, labs and discussed the procedure including the risks, benefits and alternatives for the proposed anesthesia with the patient or authorized representative who has indicated his/her understanding and acceptance.   Dental advisory given  Plan Discussed with: CRNA and Surgeon  Anesthesia Plan Comments:         Anesthesia Quick Evaluation

## 2015-06-17 NOTE — H&P (View-Only) (Signed)
[facility]ist [facility] Jacob Romero  Patient name: Jacob Romero MRN: [id number] DOB: [birth year] Sex: male  REASON FOR CONSULT: Disabling right lower extremity claudication. Referred by [name].  HPI: Jacob Romero is a 65 y.o. male, who underwent a right above-knee to below knee popliteal artery bypass with a vein graft by Dr. [name] in 2008. He was being followed by [name] with right lower extremity claudication. He experiences paresthesias in his right foot after ambulate for 15-20 minutes. He has right calf claudication which is brought on by ambulation and relieved with rest. He denies any history of rest pain. He denies any history of symptoms in the left leg.  His risk factors for peripheral vascular disease include type 2 diabetes, hypertension, hyperlipidemia, and a remote history of tobacco use.  He underwent an arteriogram on 04/21/2015 which showed a bulky calcific plaque in his right common femoral artery with an occluded superficial femoral artery. His bypass graft was being maintained through collaterals into the distal superficial femoral artery. It's really quite remarkable at the bypass remained patent despite the superficial femoral artery occlusion.  Past Medical History  Diagnosis Date  . Hyperlipidemia   . Hypertension   . Depression   . GERD (gastroesophageal reflux disease)   . Hiatal hernia   . CAD (coronary artery disease) 2011    RCA HSRA, known occlued PDA  . Sleep apnea     on C-pap  . PVD (peripheral vascular disease) ([place])     Rt FPBPG  . Dyspnea on exertion   . Bilateral lower extremity edema   . Wenckebach second degree AV block     Family History  Problem Relation Age of Onset  . Adopted: Yes    SOCIAL HISTORY: Social History   Social History  . Marital Status: Married    Spouse Name: N/A  . Number of Children: N/A  . Years of Education: N/A   Occupational History  . Not on file.   Social History  Main Topics  . Smoking status: Former Smoker    Types: Cigarettes    Quit date: 06/01/1991  . Smokeless tobacco: Never Used  . Alcohol Use: No  . Drug Use: No  . Sexual Activity: Not on file   Other Topics Concern  . 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 Statins     Unable to tolerate high dose statins    Current Outpatient Prescriptions  Medication Sig Dispense Refill  . acetaminophen (TYLENOL) 650 MG CR tablet Take 500 mg by mouth every 8 (eight) hours as needed for pain.     [name] ADVAIR DISKUS 250-50 MCG/DOSE AEPB Inhale 1 puff into the lungs daily.     [name] albuterol (PROVENTIL HFA;VENTOLIN HFA) 108 (90 BASE) MCG/ACT inhaler Inhale 2 puffs into the lungs every 6 (six) hours as needed for wheezing or shortness of breath.     [name] amLODipine (NORVASC) 5 MG tablet Take 5 mg by mouth daily.     [name] aspirin EC 81 MG tablet Take 81 mg by mouth daily.      . canagliflozin (INVOKANA) 300 MG TABS tablet Take 300 mg by mouth daily before breakfast.    . cetirizine (ZYRTEC) 10 MG tablet Take 10 mg by mouth daily as needed for allergies.     [name] clopidogrel (PLAVIX) 75 MG tablet TAKE 1 TABLET  BY MOUTH DAILY. 30 tablet 11  . DULoxetine (CYMBALTA) 30 MG capsule Take 90 mg by mouth daily. Take 3 tablets daily.    . Fiber CAPS Take 8 capsules by mouth daily.     . fish oil-omega-3 fatty acids 1000 MG capsule Take 3 g by mouth daily.     [name] ketoconazole (NIZORAL) 2 % cream Apply 1 application topically daily.    [name] LANCETS MICRO THIN 33G MISC by Does not apply route 2 (two) times daily.    [name] losartan-hydrochlorothiazide (HYZAAR) 100-12.5 MG tablet Take 1 tablet by mouth daily.    . Multiple Vitamin (MULTIVITAMIN WITH MINERALS) TABS tablet Take 1 tablet by mouth daily.    . nitroGLYCERIN (NITROSTAT) 0.4 MG SL tablet Place 0.4 mg under the tongue every 5 (five) minutes as needed for chest pain.    . pantoprazole (PROTONIX) 40 MG  tablet Take 1 tablet (40 mg total) by mouth daily. 30 tablet 11  . rosuvastatin (CRESTOR) 5 MG tablet Take 5 mg by mouth 2 (two) times a week. 2 times weekly.    [name] testosterone (ANDROGEL) 50 MG/5GM (1%) GEL Place 5 g onto the skin daily.    . Azelastine-Fluticasone (DYMISTA) 137-50 MCG/ACT SUSP Place 1 spray into the nose daily as needed (seasonal allergies). Reported on 05/28/2015     No current facility-administered medications for this visit.    REVIEW OF SYSTEMS:  [X]  denotes positive finding, [ ]  denotes negative finding Cardiac  Comments:  Chest pain or chest pressure:    Shortness of breath upon exertion:    Short of breath when lying flat:    Irregular heart rhythm:        Vascular    Pain in calf, thigh, or hip brought on by ambulation: X Right calf  Pain in feet at night that wakes you up from your sleep:     Blood clot in your veins:    Leg swelling:         Pulmonary    Oxygen at home:    Productive cough:     Wheezing:         Neurologic    Sudden weakness in arms or legs:     Sudden numbness in arms or legs:     Sudden onset of difficulty speaking or slurred speech:    Temporary loss of vision in one eye:     Problems with dizziness:         Gastrointestinal    Blood in stool:     Vomited blood:         Genitourinary    Burning when urinating:     Blood in urine:        Psychiatric    Major depression:         Hematologic    Bleeding problems:    Problems with blood clotting too easily:        Skin    Rashes or ulcers:        Constitutional    Fever or chills:      PHYSICAL EXAM: Filed Vitals:   05/28/15 1241  BP: 133/88  Pulse: 77  Temp: 98.4 F (36.9 C)  Resp: 16  Height: 5' 8.5" (1.74 m)  Weight: 257 lb (116.574 kg)  SpO2: 95%    GENERAL: The patient is a well-nourished male, in no acute distress. The vital signs are documented above. CARDIAC: There is a regular rate and rhythm.  VASCULAR: do not detect  carotid bruits. He has  palpable femoral pulses. On the right side I cannot palpate a popliteal or pedal pulses. On the left side he has palpable posterior tibial and dorsalis pedis pulses. PULMONARY: There is good air exchange bilaterally without wheezing or rales. ABDOMEN: Soft and non-tender with normal pitched bowel sounds.  MUSCULOSKELETAL: There are no major deformities or cyanosis. NEUROLOGIC: No focal weakness or paresthesias are detected. SKIN: There are no ulcers or rashes noted. PSYCHIATRIC: The patient has a normal affect.  DATA:  I have reviewed his arteriogram that was performed on 04/21/2015. He has a bulky calcific plaque in the right common femoral artery. The superficial femoral artery on the right is occluded. The distal superficial femoral artery reconstitutes via collaterals and this feeds the above-knee to below knee popliteal artery bypass graft which is patent. Proximal tibial vessels are patent.  SAPHENOUS VEIN MAP: I have independently interpreted his saphenous vein map. He does have a short segment of saphenous vein in the right thigh which appears usable but is likely not adequate length. On the left side the diameters of the saphenous vein look adequate in size.  MEDICAL ISSUES:  STATUS POST RIGHT ABOVE-KNEE TO BELOW-KNEE POPLITEAL ARTERY BYPASS: This patient has an occluded superficial femoral artery proximal to his bypass graft. It is really quite remarkable that the bypass graft has remained patent despite this. Given the risk of graft thrombosis I have recommended right common femoral artery endarterectomy and bypass from the common femoral artery to his old bypass graft above the knee. Looking at his vein map, I do not think he would have adequate vein to use the right great saphenous vein. Therefore, we will likely have to take great saphenous vein from the left side. I have reviewed the indications for lower extremity bypass. I have also reviewed the potential complications of surgery  including but not limited to: wound healing problems, infection, graft thrombosis, limb loss, or other unpredictable medical problems. All the patient's questions were answered and they are agreeable to proceed. Surgery has been scheduled for 06/17/2015. We will stop his Plavix 1 week preoperatively.   Deitra Mayo [facility] [place]: [phone]

## 2015-06-17 NOTE — Progress Notes (Signed)
   VASCULAR SURGERY POSTOP NOTE:  * Complains of right arm pain. Apparently he was lifting weights yesterday and this sounds like musculoskeletal pain.  * Stable postop.  SUBJECTIVE: complains of right arm pain.  PHYSICAL EXAM: Filed Vitals:   06/17/15 1407 06/17/15 1415 06/17/15 1422 06/17/15 1430  BP: 138/75  130/77   Pulse:  78  90  Temp:      Resp:  16  11  SpO2:  98%  98%   Brisk Doppler flow in the right foot.  CBG (last 3)   Recent Labs  06/17/15 0618  GLUCAP 116*    Active Problems:   PAD (peripheral artery disease) ([place])   Gae Gallop [id number] 06/17/2015

## 2015-06-18 ENCOUNTER — Encounter (HOSPITAL_COMMUNITY): Payer: Self-pay | Admitting: Vascular Surgery

## 2015-06-18 DIAGNOSIS — I739 Peripheral vascular disease, unspecified: Secondary | ICD-10-CM

## 2015-06-18 DIAGNOSIS — I4430 Unspecified atrioventricular block: Secondary | ICD-10-CM

## 2015-06-18 DIAGNOSIS — I10 Essential (primary) hypertension: Secondary | ICD-10-CM

## 2015-06-18 LAB — BASIC METABOLIC PANEL
Anion gap: 5 (ref 5–15)
BUN: 8 mg/dL (ref 6–20)
CO2: 30 mmol/L (ref 22–32)
Calcium: 8.2 mg/dL — ABNORMAL LOW (ref 8.9–10.3)
Chloride: 101 mmol/L (ref 101–111)
Creatinine, Ser: 1.02 mg/dL (ref 0.61–1.24)
GFR calc Af Amer: >60 mL/min (ref >60)
GLUCOSE: 126 mg/dL — AB (ref 65–99)
POTASSIUM: 3.8 mmol/L (ref 3.5–5.1)
Sodium: 136 mmol/L (ref 135–145)

## 2015-06-18 LAB — CBC
HCT: 44.1 % (ref 39.0–52.0)
Hemoglobin: 13.8 g/dL (ref 13.0–17.0)
MCH: 26.3 pg (ref 26.0–34.0)
MCHC: 31.3 g/dL (ref 30.0–36.0)
MCV: 84.2 fL (ref 78.0–100.0)
PLATELETS: 170 10*3/uL (ref 150–400)
RBC: 5.24 MIL/uL (ref 4.22–5.81)
RDW: 16.3 % — ABNORMAL HIGH (ref 11.5–15.5)
WBC: 8.1 10*3/uL (ref 4.0–10.5)

## 2015-06-18 LAB — GLUCOSE, CAPILLARY
GLUCOSE-CAPILLARY: 85 mg/dL (ref 65–99)
GLUCOSE-CAPILLARY: 93 mg/dL (ref 65–99)
GLUCOSE-CAPILLARY: 82 mg/dL (ref 65–99)
GLUCOSE-CAPILLARY: 101 mg/dL — AB (ref 65–99)

## 2015-06-18 NOTE — Evaluation (Signed)
Occupational Therapy Evaluation and Discharge Patient Details Name: Jacob Romero MRN: [id number] DOB: November 06, 1950 Today's Date: 06/18/2015    History of Present Illness Patient is a 65 y/o male with hx of HTN, HLD, CAD, PVD, stroke and asthma presents s/p REDO BYPASS GRAFT FEMORAL-POPLITEAL ARTERY with vein harvest from LLE.   Clinical Impression   Pt limited by pain. Requiring min guard assist for mobility and assist for LB ADL. Educated pt in use of AE for bathing and dressing. Will also have wife available to assist.  As pain subsides, pt will likely return to independence. No further OT needs.    Follow Up Recommendations  No OT follow up    Equipment Recommendations  None recommended by OT    Recommendations for Other Services       Precautions / Restrictions Precautions Precautions: Fall Restrictions Weight Bearing Restrictions: No      Mobility Bed Mobility               General bed mobility comments: Pt in chair.  Transfers Overall transfer level: Needs assistance Equipment used:  (pushed w/c) Transfers: Sit to/from Stand Sit to Stand: Min guard         General transfer comment: Min guard for safety.     Balance Overall balance assessment: Needs assistance Sitting-balance support: Feet supported;No upper extremity supported Sitting balance-Leahy Scale: Good     Standing balance support: During functional activity Standing balance-Leahy Scale: Fair Standing balance comment: Able to stand without UE support statically but requires UE support for ambulation due to pain.                            ADL Overall ADL's : Needs assistance/impaired Eating/Feeding: Independent;Sitting   Grooming: Supervision/safety;Standing   Upper Body Bathing: Set up;Sitting   Lower Body Bathing: Minimal assistance;Sit to/from stand Lower Body Bathing Details (indicate cue type and reason): recommended long bath sponge Upper Body Dressing : Set  up;Sitting   Lower Body Dressing: Minimal assistance;Sit to/from stand Lower Body Dressing Details (indicate cue type and reason): pt will rely on wife, instructed in use of reacher Toilet Transfer: Min guard;Ambulation;RW   Toileting- Clothing Manipulation and Hygiene: Sit to/from stand;Min guard       Functional mobility during ADLs: Herbalist     Praxis      Pertinent Vitals/Pain Pain Assessment: 0-10 Pain Score: 5  Pain Location: B LEs Pain Descriptors / Indicators: Grimacing;Guarding;Sore Pain Intervention(s): Monitored during session;Premedicated before session;Repositioned     Hand Dominance Right   Extremity/Trunk Assessment Upper Extremity Assessment Upper Extremity Assessment: Overall WFL for tasks assessed   Lower Extremity Assessment Lower Extremity Assessment: Defer to PT evaluation   Cervical / Trunk Assessment Cervical / Trunk Assessment: Normal   Communication Communication Communication: No difficulties   Cognition Arousal/Alertness: Awake/alert Behavior During Therapy: WFL for tasks assessed/performed Overall Cognitive Status: Within Functional Limits for tasks assessed                     General Comments       Exercises       Shoulder Instructions      Home Living Family/patient expects to be discharged to:: Private residence Living Arrangements: Spouse/significant other Available Help at Discharge: Family;Available 24 hours/day Type of Home: House Home Access: Stairs to enter [employer] of Steps: 1 + 1 Entrance  Stairs-Rails: None Home Layout: One level     Bathroom Shower/Tub: Occupational psychologist: Handicapped height     Home Equipment: Environmental consultant - 2 wheels;Adaptive equipment Adaptive Equipment: Reacher        Prior Functioning/Environment Level of Independence: Independent        Comments: Just retired.     OT Diagnosis: Acute  pain;Generalized weakness   OT Problem List:     OT Treatment/Interventions:      OT Goals(Current goals can be found in the care plan section) Acute Rehab OT Goals Patient Stated Goal: to [place] to the beach once feeling better and get a puppy  OT Frequency:     Barriers to D/C:            Co-evaluation              End of Session    Activity Tolerance: Patient limited by pain Patient left: in chair;with call bell/phone within reach   Time: 1433-1458 OT Time Calculation (min): 25 min Charges:  OT General Charges $OT Visit: 1 Procedure OT Evaluation $OT Eval Low Complexity: 1 Procedure OT Treatments $Self Care/Home Management : 8-22 mins G-Codes:    Malka So 06/18/2015, 3:57 PM  [phone]

## 2015-06-18 NOTE — Evaluation (Signed)
Physical Therapy Evaluation Patient Details Name: Jacob Romero MRN: [id number] DOB: 23-Jan-1951 Today's Date: 06/18/2015   History of Present Illness  Patient is a 64 y/o male with hx of HTN, HLD, CAD, PVD, stroke and asthma presents s/p REDO BYPASS GRAFT FEMORAL-POPLITEAL ARTERY with vein harvest from LLE.  Clinical Impression  Patient presents with pain and mild balance deficits impacting mobility. Tolerated gait training with Min guard assist for safety. Will attempt RW next session to help with balance and pain control. Pt has support from wife at d/c. Will attempt stairs tomorrow and RW prior to d/c. Will follow acutely to maximize independence and mobility prior to return home.    Follow Up Recommendations Supervision - Intermittent;No PT follow up    Equipment Recommendations  None recommended by PT    Recommendations for Other Services       Precautions / Restrictions Precautions Precautions: Fall Restrictions Weight Bearing Restrictions: No      Mobility  Bed Mobility               General bed mobility comments: Sitting in chair upon PT arrival.   Transfers Overall transfer level: Needs assistance Equipment used:  (pushed w/c) Transfers: Sit to/from Stand Sit to Stand: Min guard         General transfer comment: Min guard for safety.   Ambulation/Gait Ambulation/Gait assistance: Min guard Ambulation Distance (Feet): 200 Feet Assistive device:  (pushed w/c.) Gait Pattern/deviations: Step-through pattern;Decreased stride length;Trunk flexed   Gait velocity interpretation: Below normal speed for age/gender General Gait Details: Mildly unsteady gait with a few short standing rest breaks. Some difficulty navigating w/c- will try RW next session.  Stairs            Wheelchair Mobility    Modified Rankin (Stroke Patients Only)       Balance Overall balance assessment: Needs assistance Sitting-balance support: Feet supported;No upper  extremity supported Sitting balance-Leahy Scale: Good     Standing balance support: During functional activity Standing balance-Leahy Scale: Fair Standing balance comment: Able to stand without UE support statically but requires UE support for ambulation due to pain.                             Pertinent Vitals/Pain Pain Assessment: 0-10 Pain Score: 5  Pain Location: BLEs, RLE>LLE Pain Descriptors / Indicators: Sore;Grimacing;Guarding Pain Intervention(s): Monitored during session;Repositioned;Patient requesting pain meds-RN notified    Home Living Family/patient expects to be discharged to:: Private residence Living Arrangements: Spouse/significant other Available Help at Discharge: Family;Available 24 hours/day Type of Home: House Home Access: Stairs to enter Entrance Stairs-Rails: None Entrance Stairs-Number of Steps: 1 + 1 Home Layout: One level Home Equipment: Walker - 2 wheels      Prior Function Level of Independence: Independent         Comments: Just retired.      Hand Dominance        Extremity/Trunk Assessment   Upper Extremity Assessment: Defer to OT evaluation           Lower Extremity Assessment: Overall WFL for tasks assessed         Communication   Communication: No difficulties  Cognition Arousal/Alertness: Awake/alert Behavior During Therapy: WFL for tasks assessed/performed Overall Cognitive Status: Within Functional Limits for tasks assessed                      General Comments General comments (skin integrity,  edema, etc.): Vitals stable.    Exercises        Assessment/Plan    PT Assessment Patient needs continued PT services  PT Diagnosis Difficulty walking;Acute pain   PT Problem List Pain;Decreased activity tolerance;Decreased balance;Decreased mobility;Decreased knowledge of use of DME  PT Treatment Interventions Therapeutic exercise;Gait training;Stair training;Functional mobility  training;Therapeutic activities;Patient/family education;Balance training;DME instruction   PT Goals (Current goals can be found in the Care Plan section) Acute Rehab PT Goals Patient Stated Goal: to move to the beach once feeling better and get a puppy PT Goal Formulation: With patient Time For Goal Achievement: 07/02/15 Potential to Achieve Goals: Good    Frequency Min 3X/week   Barriers to discharge        Co-evaluation               End of Session Equipment Utilized During Treatment: Gait belt Activity Tolerance: Patient tolerated treatment well Patient left: in chair;with call bell/phone within reach;with nursing/sitter in room Nurse Communication: Mobility status         Time: [id number] PT Time Calculation (min) (ACUTE ONLY): 23 min   Charges:   PT Evaluation $PT Eval Moderate Complexity: 1 Procedure PT Treatments $Gait Training: 8-22 mins   PT G Codes:        Keland Peyton A [name] Dimaria 06/18/2015, 2:37 PM Wray Wesolowski, [place], DPT [phone]

## 2015-06-18 NOTE — Progress Notes (Addendum)
[facility] of [facility]  VASCULAR SURGERY ASSESSMENT AND PLAN:  Agree with the note below. Patient is doing well. Transfer to 2 W. Possible discharge tomorrow.  Jacob Mayo, MD, FACS Beeper [phone] Office: [phone]   Subjective  - Doing well.  He walked a little last night.  Pain is well controlled with both PO and IVP.  Objective 119/57 59 98.8 F (37.1 C) (Oral) 13 89%  Intake/Output Summary (Last 24 hours) at 06/18/15 0724 Last data filed at 06/18/15 0700  Gross per 24 hour  Intake   3365 ml  Output   1590 ml  Net   1775 ml    Incisions clean and dry, groins soft Doppler signals bilaterally PT/DP/Peroneal Lungs non labored breathing Heart RRR  Assessment/Planning: POD # 1 Redo right femoropopliteal bypass graft with left great saphenous vein  He is doing well over all, pain with ambulation controlled with medication PT/OT eval and treat ordered Will transfer to 2W Disposition stable  Jacob Romero 06/18/2015 7:24 AM --  Laboratory Lab Results:  Recent Labs  06/18/15 0250  WBC 8.1  HGB 13.8  HCT 44.1  PLT 170   BMET  Recent Labs  06/18/15 0250  NA 136  K 3.8  CL 101  CO2 30  GLUCOSE 126*  BUN 8  CREATININE 1.02  CALCIUM 8.2*    COAG Lab Results  Component Value Date   INR 1.12 06/11/2015   INR 1.10 04/15/2015   INR 1.09 11/13/2012   No results found for: PTT

## 2015-06-18 NOTE — Care Management Note (Signed)
Case Management Note  Patient Details  Name: MATVIY SORANNO MRN: [id number] Date of Birth: Dec 21, 1950  Subjective/Objective:        Pt is s/p REDO BYPASS GRAFT FEMORAL-POPLITEAL ARTERY (Right) VEIN HARVEST (Left) as a surgical intervention .             Action/Plan:  Pt is independent from home with wife.  Wife will be with pt post discharge.  CM will continue to monitor for disposition needs   Expected Discharge Date:                  Expected Discharge Plan:  Home/Self Care  In-House Referral:     Discharge planning Services  CM Consult  Post Acute Care Choice:    Choice offered to:     DME Arranged:    DME Agency:     HH Arranged:    HH Agency:     Status of Service:  In process, will continue to follow  Medicare Important Message Given:    Date Medicare IM Given:    Medicare IM give by:    Date Additional Medicare IM Given:    Additional Medicare Important Message give by:     If discussed at [place] of Stay Meetings, dates discussed:    Additional Comments:  Maryclare Labrador, RN 06/18/2015, 10:48 AM

## 2015-06-18 NOTE — Consult Note (Addendum)
CARDIOLOGY CONSULT NOTE   Patient ID: Jacob Romero MRN: [id number] DOB/AGE: [birth year] 66 y.o.  Admit date: 06/17/2015  Primary Physician   Charletta Cousin, MD Primary Cardiologist   Dr. Gwenlyn Found Reason for Consultation   ? AFib  Requesting Physician  Dr. Scot Dock  HPI: Jacob Romero is a 65 y.o. 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 Now cardiology consulted for questionable afib.    Hx of CAD. Cath May 15, 2009 via the right radial approach by Dr. Gwenlyn Found revealing a high-grade mid RCA stenosis with a totally occluded PLA. There was an anterior takeoff. Attempted recanalization, however, he developed a subarachnoid hemorrhage and the procedure was aborted.  Dr. Claiborne Billings brought him back 06/2009 and performed high-speed rotational atherectomy, PCI and stenting of his mid RCA but was unable to recanalize the PLA branch.   Last cath 10/2012--> widely patent stent in his RCA with occluded posterior lateral branch that is recanalized. He has normal left ventricular function. His anatomy is unchanged from his prior cath.   1. Left main; normal   2. LAD; normal  3. Left circumflex; codominant and normal.   4. Right coronary artery; codominant with a widely patent stent in the midportion and an occluded posterior lateral branch with  recanalization un changed change from his prior angiogram  5. Left ventriculography; RAO left ventriculogram was performed using   25 mL of Visipaque dye at 12 mL/second. The overall LVEF estimated   60 % Without wall motion abnormalities  Hx of right above-knee to below knee popliteal artery bypass with a vein graft by Dr. [name] in 2008. He was being followed by [name] with right lower extremity claudication. He underwent an arteriogram on  04/21/2015 which showed a bulky calcific plaque in his right common femoral artery with an occluded superficial femoral artery. His bypass graft was being maintained through collaterals into the distal superficial femoral artery. Subsequently under went 06/17/15 Redo right femoropopliteal bypass graft with left great saphenous vein. The patient denies nausea, vomiting, fever, chest pain, palpitations, shortness of breath, orthopnea, PND, dizziness, syncope, cough, congestion, abdominal pain, hematochezia, melena, lower extremity edema.   Past Medical History  Diagnosis Date  . Hyperlipidemia   . Hypertension   . Depression   . GERD (gastroesophageal reflux disease)   . Hiatal hernia   . CAD (coronary artery disease) 2011    RCA HSRA, known occlued PDA  . Sleep apnea     on C-pap  . PVD (peripheral vascular disease) ([place])     Rt FPBPG  . Dyspnea on exertion   . Bilateral lower extremity edema   . Wenckebach second degree AV block   . Stroke [facility])     2011  ??  VESSEL BURST IN BRAIN  DUE TO BLOOD THINNERS  . Asthma     CHRONIC BRONCHITIS  . Headache     HX  MIGRAINES   . Arthritis   . Fatty liver      Past Surgical History  Procedure Laterality Date  . Pr vein bypass graft,aorto-fem-pop  2007-2008    multiple rt fem-pop bpg and stents  . Angioplasty  Feb 2011    aborted PCI secondary to [facility]  . Angioplasty  April 2011    RCA HSRA  .  Cardiac catheterization  [id number]    stents  . Tonsillectomy    . Colonoscopy    . Finger arthroplasty      both middle fingers  . Cystoscopy w/ ureteroscopy w/ lithotripsy    . Shoulder arthroscopy with rotator cuff repair and subacromial decompression Right 04/27/2013    Procedure: RIGHT SHOULDER ARTHROSCOPY WITH DEBRIDEMENT WITH OPEN RECONSTRUCTION ROTATOR CUFF;  Surgeon: Cammie Sickle., MD;  Location: [place];  Service: Orthopedics;  Laterality: Right;  . Left heart catheterization with coronary angiogram N/A 11/21/2012      Procedure: LEFT HEART CATHETERIZATION WITH CORONARY ANGIOGRAM;  Surgeon: Lorretta Harp, MD;  Location: [facility] CATH LAB;  Service: Cardiovascular;  Laterality: N/A;  . Peripheral vascular catheterization N/A 04/21/2015    Procedure: Lower Extremity Angiography;  Surgeon: Lorretta Harp, MD;  Location: [place] CV LAB;  Service: Cardiovascular;  Laterality: N/A;  . Femoral-popliteal bypass graft Right 06/17/2015    Procedure: REDO BYPASS GRAFT RIGHT FEMORAL TO ABOVE KNEE POPLITEAL ARTERY USING LEFT NON-REVERSED GREATER SAPHENOUS VEIN;  Surgeon: Angelia Mould, MD;  Location: [place];  Service: Vascular;  Laterality: Right;  . Vein harvest Left 06/17/2015    Procedure: VEIN HARVEST LEFT GREATER SAPHENOUS VEIN;  Surgeon: Angelia Mould, MD;  Location: [place];  Service: Vascular;  Laterality: Left;  . Endarterectomy femoral Right 06/17/2015    Procedure: ENDARTERECTOMY RIGHT COMMON FEMORAL ARTERY;  Surgeon: Angelia Mould, MD;  Location: [place];  Service: Vascular;  Laterality: Right;    Allergies  Allergen Reactions  . Bee Venom Swelling  . Statins Other (See Comments)    Unable to tolerate high dose statins    I have reviewed the patient's current medications . amLODipine  5 mg Oral Daily  . aspirin EC  81 mg Oral Daily  . Chlorhexidine Gluconate Cloth  6 each Topical Q0600  . clopidogrel  75 mg Oral Daily  . docusate sodium  100 mg Oral Daily  . DULoxetine  90 mg Oral QHS  . enoxaparin (LOVENOX) injection  40 mg Subcutaneous Q24H  . hydrochlorothiazide  12.5 mg Oral Daily  . insulin aspart  0-9 Units Subcutaneous TID WC  . losartan  100 mg Oral Daily  . metFORMIN  1,000 mg Oral Q breakfast  . multivitamin with minerals  1 tablet Oral Daily  . mupirocin ointment  1 application Nasal BID  . pantoprazole  40 mg Oral Daily  . [START ON 06/19/2015] rosuvastatin  5 mg Oral Once per day on Mon Thu  . testosterone  5 g Transdermal Daily   . sodium chloride 125 mL/hr at  06/18/15 1200   sodium chloride, acetaminophen **OR** acetaminophen, albuterol, alum & mag hydroxide-simeth, bisacodyl, guaiFENesin-dextromethorphan, hydrALAZINE, labetalol, magnesium sulfate 1 - 4 g bolus IVPB, metoprolol, morphine injection, nitroGLYCERIN, ondansetron, oxyCODONE-acetaminophen, phenol, potassium chloride, senna-docusate, traMADol  Prior to Admission medications   Medication Sig Start Date End Date Taking? Authorizing Provider  ADVAIR DISKUS 250-50 MCG/DOSE AEPB Inhale 1 puff into the lungs daily.  10/19/12  Yes Historical Provider, MD  albuterol (PROVENTIL HFA;VENTOLIN HFA) 108 (90 BASE) MCG/ACT inhaler Inhale 2 puffs into the lungs every 6 (six) hours as needed for wheezing or shortness of breath.    Yes Historical Provider, MD  amLODipine (NORVASC) 5 MG tablet Take 5 mg by mouth daily.  07/22/14  Yes Historical Provider, MD  aspirin EC 81 MG tablet Take 81 mg by mouth daily.     Yes Historical  Provider, MD  cetirizine (ZYRTEC) 10 MG tablet Take 10 mg by mouth daily as needed for allergies.    Yes Historical Provider, MD  clopidogrel (PLAVIX) 75 MG tablet TAKE 1 TABLET BY MOUTH DAILY. 08/28/14  Yes Lorretta Harp, MD  DULoxetine (CYMBALTA) 30 MG capsule Take 90 mg by mouth at bedtime. Take 3 tablets daily. 10/11/12  Yes Historical Provider, MD  Fiber CAPS Take 8 capsules by mouth daily.    Yes Historical Provider, MD  fish oil-omega-3 fatty acids 1000 MG capsule Take 3 g by mouth daily.    Yes Historical Provider, MD  ketoconazole (NIZORAL) 2 % cream Apply 1 application topically daily.   Yes Historical Provider, MD  LANCETS MICRO THIN 33G MISC by Does not apply route 2 (two) times daily.   Yes Historical Provider, MD  losartan-hydrochlorothiazide (HYZAAR) 100-12.5 MG tablet Take 1 tablet by mouth daily.   Yes Historical Provider, MD  metFORMIN (GLUMETZA) 500 MG (MOD) 24 hr tablet Take 1,000 mg by mouth daily with breakfast.   Yes Historical Provider, MD  Multiple Vitamin  (MULTIVITAMIN WITH MINERALS) TABS tablet Take 1 tablet by mouth daily.   Yes Historical Provider, MD  pantoprazole (PROTONIX) 40 MG tablet Take 1 tablet (40 mg total) by mouth daily. 08/21/14  Yes Lorretta Harp, MD  rosuvastatin (CRESTOR) 5 MG tablet Take 5 mg by mouth 2 (two) times a week. 2 times weekly.   Yes Historical Provider, MD  testosterone (ANDROGEL) 50 MG/5GM (1%) GEL Place 5 g onto the skin daily.   Yes Historical Provider, MD  tetrahydrozoline-zinc (VISINE-AC) 0.05-0.25 % ophthalmic solution Place 1 drop into both eyes 2 (two) times daily.   Yes Historical Provider, MD  traMADol (ULTRAM) 50 MG tablet Take 50 mg by mouth every 12 (twelve) hours as needed for moderate pain.   Yes Historical Provider, MD  acetaminophen (TYLENOL) 650 MG CR tablet Take 1,000 mg by mouth 2 (two) times daily.     Historical Provider, MD  Azelastine-Fluticasone (DYMISTA) 137-50 MCG/ACT SUSP Place 1 spray into the nose daily as needed (seasonal allergies). Reported on 05/28/2015    Historical Provider, MD  loratadine (CLARITIN) 10 MG tablet Take 10 mg by mouth daily.    Historical Provider, MD  loratadine-pseudoephedrine (CLARITIN-D 24-HOUR) 10-240 MG 24 hr tablet Take 1 tablet by mouth daily.    Historical Provider, MD  nitroGLYCERIN (NITROSTAT) 0.4 MG SL tablet Place 0.4 mg under the tongue every 5 (five) minutes as needed for chest pain.    Historical Provider, MD     Social History   Social History  . Marital Status: Married    Spouse Name: N/A  . Number of Children: N/A  . Years of Education: N/A   Occupational History  . Not on file.   Social History Main Topics  . Smoking status: Former Smoker    Types: Cigarettes    Quit date: 06/01/1991  . Smokeless tobacco: Never Used  . Alcohol Use: 0.0 oz/week    0 Standard drinks or equivalent per week     Comment: RARE  . Drug Use: No  . Sexual Activity: Not on file   Other Topics Concern  . 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 Mother Deceased   . Father Deceased    Family History  Problem Relation Age of Onset  . Adopted: Yes  ROS:  Full 14 point review of systems complete and found to be negative unless listed above.  Physical Exam: Blood pressure 100/67, pulse 61, temperature 97.6 F (36.4 C), temperature source Oral, resp. rate 16, height 5\' 8"  (1.727 m), weight 253 lb 15.5 oz (115.2 kg), SpO2 90 %.  General: Well developed, well nourished, male in no acute distress Head: Eyes PERRLA, No xanthomas. Normocephalic and atraumatic, oropharynx without edema or exudate.  Lungs: Resp regular and unlabored, CTA. Heart: RRR no s3, s4, or murmurs..   Neck: No carotid bruits. No lymphadenopathy.  JVD. Abdomen: Bowel sounds present, abdomen soft and non-tender without masses or hernias noted. Msk:  No spine or cva tenderness. No weakness, no joint deformities or effusions. Extremities: No clubbing, cyanosis or edema. DP/PT/Radials 2+ and equal bilaterally. Neuro: Alert and oriented X 3. No focal deficits noted. Psych:  Good affect, responds appropriately Skin: No rashes or lesions noted.  Labs:   Lab Results  Component Value Date   WBC 8.1 06/18/2015   HGB 13.8 06/18/2015   HCT 44.1 06/18/2015   MCV 84.2 06/18/2015   PLT 170 06/18/2015   No results for input(s): INR in the last 72 hours.  Recent Labs Lab 06/18/15 0250  NA 136  K 3.8  CL 101  CO2 30  BUN 8  CREATININE 1.02  CALCIUM 8.2*  GLUCOSE 126*   No results found for: MG No results for input(s): CKTOTAL, CKMB, TROPONINI in the last 72 hours. No results for input(s): TROPIPOC in the last 72 hours. PRO B NATRIURETIC PEPTIDE (BNP)  Date/Time Value Ref Range Status  05/14/2009 12:18 PM 83.0 0.0 - 100.0 pg/mL Final   Lab Results  Component Value Date   CHOL  05/15/2009    136        ATP III CLASSIFICATION:  <200     mg/dL   Desirable  200-239  mg/dL    Borderline High  >=240    mg/dL   High          HDL 41 05/15/2009   LDLCALC  05/15/2009    75        Total Cholesterol/HDL:CHD Risk Coronary Heart Disease Risk Table                     Men   Women  1/2 Average Risk   3.4   3.3  Average Risk       5.0   4.4  2 X Average Risk   9.6   7.1  3 X Average Risk  23.4   11.0        Use the calculated Patient Ratio above and the CHD Risk Table to determine the patient's CHD Risk.        ATP III CLASSIFICATION (LDL):  <100     mg/dL   Optimal  100-129  mg/dL   Near or Above                    Optimal  130-159  mg/dL   Borderline  160-189  mg/dL   High  >190     mg/dL   Very High   TRIG 101 05/15/2009   Lab Results  Component Value Date   DDIMER * 05/14/2009    0.64        AT THE INHOUSE ESTABLISHED CUTOFF VALUE OF 0.48 ug/mL FEU, THIS ASSAY HAS BEEN DOCUMENTED IN THE LITERATURE TO HAVE A SENSITIVITY AND NEGATIVE PREDICTIVE VALUE OF  AT LEAST 98 TO 99%.  THE TEST RESULT SHOULD BE CORRELATED WITH AN ASSESSMENT OF THE CLINICAL PROBABILITY OF DVT / VTE.   No results found for: LIPASE, AMYLASE TSH  Date/Time Value Ref Range Status  04/15/2015 11:45 AM 0.712 0.350 - 4.500 uIU/mL Final   No results found for: VITAMINB12, FOLATE, FERRITIN, TIBC, IRON, RETICCTPCT  Cath 11/22/2014 ANGIOGRAPHIC RESULTS:   1. Left main; normal  2. LAD; normal 3. Left circumflex; codominant and normal.  4. Right coronary artery; codominant with a widely patent stent in the midportion and an occluded posterior lateral branch with recanalization un changed change from his prior angiogram 5. Left ventriculography; RAO left ventriculogram was performed using  25 mL of Visipaque dye at 12 mL/second. The overall LVEF estimated  60 % Without wall motion abnormalities  IMPRESSION:Mr. Wesler has a widely patent stent in his RCA with occluded posterior lateral branch that is recanalized. He has normal left ventricular function. His anatomy is unchanged  from his prior cath. These findings cannot explain his current symptoms. A femoral angiogram was performed and a MYNX closure device was then deployed successfully obtaining hemostasis. The patient left the lab in stable condition. He'll be discharged him in 2 hours and will see me back in the office in a week for followup.  ECG: @  13:17:26  Sinus rhythm with 2nd degree AV block (type 1) --> concerning for complete block (reading as afib with junctional rhythm)            @ 13:17: 57  Sinus rhythm with 2nd degree AV block (type 1) -   Echo 10/2012 LV EF: 45% -  50%  ------------------------------------------------------------ Indications:   Shortness of breath 786.05.  ------------------------------------------------------------ History:  PMH:  Coronary artery disease. Risk factors: Sleep apnea on CPAP Former tobacco use. Hypertension. Obese. Dyslipidemia.  ------------------------------------------------------------ Study Conclusions  - Left ventricle: The cavity size was normal. Wall thickness was increased in a pattern of moderate LVH. Systolic function was mildly reduced. The estimated ejection fraction was in the range of 45% to 50%. Moderate hypokinesis of the inferior myocardium. - Ventricular septum: Septal motion showed paradox. - Mitral valve: Mild regurgitation. - Left atrium: The atrium was moderately dilated. - Atrial septum: No defect or patent foramen ovale was identified. Radiology:  No results found.  ASSESSMENT AND PLAN:     1.  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 Consulted for afib.  NO afib seen  ON Review of EKG & telemetery looks like AV Wenkebach.    BP has been OK  Pt asymptomatic  QRS narrow Pt not on any  AV blocking agents  Would avoid . Will review with EP     2. CAD - status post RCA and PLA intervention.in 2011 ( high-speed rotational atherectomy, PCI and stenting of his mid RCA; unable to recanalize his PLA branch).  - Last  cath  because of decline in his EF and an abnormal Myoview 11/21/12 revealing a patent RCA stent with an occluded posterior lateral branch which was chronic - Ef of 45-50% on last echo 10/2012.  Pt currently without angina   Do not think current rhythm due   3. PVD - s/p redo 06/17/15 right femoropopliteal bypass graft with left great saphenous vein.   4. HTN  BP is OK  Signed: Bhagat,Bhavinkumar, PA 06/18/2015, 3:44 PM Pager [phone]   Pt seen and examined.  Agree with findings of B Bhagat  I have amended note to  reflect my findings Pt comfortable  No dizziensss  Lungs CTA  Cardiac RRR  No S3  Ext  Incision sites clean and dry   No signif LE edema.   I have reviewed with EP  No   Continue Tele.  NO indication for further treatment  OK to tx to floor.  Dorris Carnes

## 2015-06-19 ENCOUNTER — Inpatient Hospital Stay (HOSPITAL_COMMUNITY): Payer: Medicare Other

## 2015-06-19 DIAGNOSIS — I4891 Unspecified atrial fibrillation: Secondary | ICD-10-CM

## 2015-06-19 LAB — HEMOGLOBIN A1C
Hgb A1c MFr Bld: 6.3 % — ABNORMAL HIGH (ref 4.8–5.6)
Mean Plasma Glucose: 134 mg/dL

## 2015-06-19 LAB — GLUCOSE, CAPILLARY
GLUCOSE-CAPILLARY: 83 mg/dL (ref 65–99)
Glucose-Capillary: 99 mg/dL (ref 65–99)

## 2015-06-19 MED ORDER — OXYCODONE-ACETAMINOPHEN 5-325 MG PO TABS
1.0000 | ORAL_TABLET | ORAL | Status: DC | PRN
Start: 2015-06-19 — End: 2015-06-24

## 2015-06-19 MED ORDER — OXYCODONE-ACETAMINOPHEN 5-325 MG PO TABS: 1.0000 | ORAL_TABLET | ORAL | Status: DC | PRN

## 2015-06-19 MED ORDER — APIXABAN 5 MG PO TABS: 5.0000 mg | ORAL_TABLET | Freq: Two times a day (BID) | ORAL | Status: DC

## 2015-06-19 NOTE — Progress Notes (Signed)
   Subjective: No CP  No palpitations   NO SOB   Objective: Filed Vitals:   06/19/15 0700 06/19/15 0800 06/19/15 0900 06/19/15 1000  BP: 126/67 125/64  129/65  Pulse: 58 63 59   Temp: 97.2 F (36.2 C)     TempSrc: Oral     Resp: 16 17 18 17   Height:      Weight:      SpO2: 95% 95% 96% 96%   Weight change:   Intake/Output Summary (Last 24 hours) at 06/19/15 1209 Last data filed at 06/19/15 1000  Gross per 24 hour  Intake   1235 ml  Output   1450 ml  Net   -215 ml    General: Alert, awake, oriented x3, in no acute distress Neck:  JVP is normal Heart: Regular rate and rhythm, without murmurs, rubs, gallops.  Lungs: Clear to auscultation.  No rales or wheezes. Exemities:  No edema.   Neuro: Grossly intact, nonfocal.  Tele  SR with Wenkebach.  Transient spells of afib  Rates 100s  Lab Results: Results for orders placed or performed during the hospital encounter of 06/17/15 (from the past 24 hour(s))  Glucose, capillary     Status: None   Collection Time: 06/18/15  1:07 PM  Result Value Ref Range   Glucose-Capillary 93 65 - 99 mg/dL   Comment 1 Capillary Specimen    Comment 2 Notify RN   Glucose, capillary     Status: None   Collection Time: 06/18/15  4:39 PM  Result Value Ref Range   Glucose-Capillary 82 65 - 99 mg/dL   Comment 1 Capillary Specimen    Comment 2 Notify RN   Glucose, capillary     Status: Abnormal   Collection Time: 06/18/15  9:53 PM  Result Value Ref Range   Glucose-Capillary 101 (H) 65 - 99 mg/dL   Comment 1 Capillary Specimen    Comment 2 Notify RN    Comment 3 Document in Chart   Glucose, capillary     Status: None   Collection Time: 06/19/15  8:18 AM  Result Value Ref Range   Glucose-Capillary 99 65 - 99 mg/dL   Comment 1 Notify RN     Studies/Results: No results found.  Medications: Reviewed    @PROBHOSP @   1  Rhythm  Review of tele pt has had several episodes of atrial fib  Transient  HR abruptly increases to about 100  The  drops back down to 60s (and Wenkebach)  Pt asymptomatic  He has not had before    CHADS VASC score is   I do not think afib driven by recent surgery  Pt feels great.  Will check TSH to be complete.    Review of history pt has had a history of SAH in 2011 in setting of ASA/PLAVIx/Integrelin/heparin. I have reviewed with Adora Fridge (primary cardiologist  WOuld like to have neuro input on use on anticoagulant in this pt  AGain, episode in Feb 2011  Notes in EPIC  2.  CAD  PT  With remote intervention.  Last cath 2014.  No symtpmos to suggest angina  LVEF 45 to 50%  Volume status looks good.  3.  PVOD  Pt is Day 2 form right fempop  4  HTN  Follow    LOS: 2 days   Dorris Carnes 06/19/2015, 12:09 PM

## 2015-06-19 NOTE — Progress Notes (Signed)
I have been asked to comment on safety of long term anticoagulation in this patient for AFIB with high CHAD2VASc score by Dr Harrington Challenger and Gwenlyn Found.He has history of  Left perisylvian SAH in 2011 in setting of ASA/PLAVIx/Integrelin/heparin therapy likely related to medication effect and likely a one time deal. I have reviewed patient`s chart but not seen him.Recommend eliquis over warfarin given lower risk of intracranial bleeding.Avoid using multiple agents if possible. Call for questions. Antony Contras, MD

## 2015-06-19 NOTE — Progress Notes (Addendum)
Physical Therapy Treatment Patient Details Name: Jacob Romero MRN: [id number] DOB: 22-Feb-1951 Today's Date: 06/19/2015    History of Present Illness Patient is a 65 y/o male with hx of HTN, HLD, CAD, PVD, stroke and asthma presents s/p REDO BYPASS GRAFT FEMORAL-POPLITEAL ARTERY with vein harvest from LLE.    PT Comments    Patient is progressing well towards PT goals. Ambulating 500' with RW with Supervision for safety. Pain more controlled today allowing for increased activity. Discussed safe stair negotiation technique to get into home with assist from son. Pt in A-fib during mobility. Will continue to follow per current POC.   Follow Up Recommendations  Supervision - Intermittent;No PT follow up     Equipment Recommendations  None recommended by PT    Recommendations for Other Services       Precautions / Restrictions Precautions Precautions: Fall Restrictions Weight Bearing Restrictions: No    Mobility  Bed Mobility               General bed mobility comments: Pt in chair.  Transfers Overall transfer level: Needs assistance Equipment used: Rolling walker (2 wheeled) Transfers: Sit to/from Stand Sit to Stand: Min guard         General transfer comment: Min guard for safety.   Ambulation/Gait Ambulation/Gait assistance: Supervision Ambulation Distance (Feet): 500 Feet Assistive device: Rolling walker (2 wheeled) Gait Pattern/deviations: Step-through pattern;Decreased stride length   Gait velocity interpretation: Below normal speed for age/gender General Gait Details: More step through gait today with less pain. A-fib ranging from 60-89 bpm.   Stairs            Wheelchair Mobility    Modified Rankin (Stroke Patients Only)       Balance Overall balance assessment: Needs assistance Sitting-balance support: Feet supported;No upper extremity supported Sitting balance-Leahy Scale: Good     Standing balance support: During functional  activity Standing balance-Leahy Scale: Fair Standing balance comment: Able to stand unsupported for short periods.                     Cognition Arousal/Alertness: Awake/alert Behavior During Therapy: WFL for tasks assessed/performed Overall Cognitive Status: Within Functional Limits for tasks assessed                      Exercises      General Comments General comments (skin integrity, edema, etc.): BP 111/97 post ambulation.       Pertinent Vitals/Pain Pain Assessment: Faces Faces Pain Scale: Hurts a little bit Pain Location: BLEs Pain Descriptors / Indicators: Sore Pain Intervention(s): Monitored during session;Repositioned;Premedicated before session    Home Living                      Prior Function            PT Goals (current goals can now be found in the care plan section) Progress towards PT goals: Progressing toward goals    Frequency  Min 3X/week    PT Plan Current plan remains appropriate    Co-evaluation             End of Session Equipment Utilized During Treatment: Gait belt Activity Tolerance: Patient tolerated treatment well Patient left: in chair;with call bell/phone within reach     Time: 1004-1020 PT Time Calculation (min) (ACUTE ONLY): 16 min  Charges:  $Gait Training: 8-22 mins  G Codes:      Jacob Romero A Jacob Romero 06/19/2015, 11:05 AM Jacob Romero, PT, DPT [phone]

## 2015-06-19 NOTE — Significant Event (Signed)
Received a call from Dr. Harrington Challenger. MD wants neurology to see patient before discharge, want clearance for anticoagulant. Relay this information to RN Jerene Pitch who will take over care of patient in 2W11. Vascular PA to see patient after case-will relay this to her.

## 2015-06-19 NOTE — Significant Event (Signed)
Patient ambulated from 2S10 to 2W11, tolerated ambulation well. Settled in chair. All belongings in new room (CPAP mask). Family at bedside; updated on patient's status. Report given to receiving RN Jerene Pitch.

## 2015-06-19 NOTE — Progress Notes (Signed)
Reviewed discharge instructions with patient and family. No issues at present. IV removed. Pt awaiting discharge via wheelchair. Josseline Reddin, Mervin Kung RN

## 2015-06-19 NOTE — Progress Notes (Signed)
      Patient is being discharged today on Eliquis 5mg  po BID and Asprin 81 mg.  We are stopping the Plavix per Dr. Leonie Man.      [name], [name] PA-C

## 2015-06-19 NOTE — Progress Notes (Signed)
   VASCULAR SURGERY ASSESSMENT & PLAN:  * 2 Days Post-Op s/p: Redo right fempop bypass  *  Appreciate Dr. Alan Ripper help yesterday. No further w/u needed.  * Home today if ok with Cardiology. Otherwise, transfer to 2W  SUBJECTIVE: No complaints. Ambulated yesterday.  PHYSICAL EXAM: Filed Vitals:   06/19/15 0400 06/19/15 0500 06/19/15 0600 06/19/15 0700  BP: 114/64 129/69 120/55 126/67  Pulse: 62 58 59 58  Temp: 98.7 F (37.1 C)     TempSrc: Oral     Resp: 14 15 14 16   Height:      Weight:      SpO2: 99% 100% 93% 95%   Palpable right PT pulse. Incisions all look fine.   LABS: Lab Results  Component Value Date   WBC 8.1 06/18/2015   HGB 13.8 06/18/2015   HCT 44.1 06/18/2015   MCV 84.2 06/18/2015   PLT 170 06/18/2015   Lab Results  Component Value Date   CREATININE 1.02 06/18/2015   Lab Results  Component Value Date   INR 1.12 06/11/2015   CBG (last 3)   Recent Labs  06/18/15 1307 06/18/15 1639 06/18/15 2153  GLUCAP 93 82 101*   Active Problems:   PAD (peripheral artery disease) ([place])  Jacob Romero [id number] 06/19/2015

## 2015-06-20 ENCOUNTER — Telehealth: Payer: Self-pay | Admitting: Vascular Surgery

## 2015-06-20 NOTE — Telephone Encounter (Signed)
-----   Message from Denman George, RN sent at 06/19/2015  9:22 AM EDT ----- Regarding: needs 2 week post op f/u with [facility]   ----- Message -----    From: Ulyses Amor, PA-C    Sent: 06/19/2015   7:43 AM      To: Vvs Charge Pool  F/U s/p fem-pop with Dr. Scot Dock 2 weeks

## 2015-06-24 ENCOUNTER — Telehealth: Payer: Self-pay

## 2015-06-24 ENCOUNTER — Encounter: Payer: Self-pay | Admitting: Vascular Surgery

## 2015-06-24 DIAGNOSIS — G8918 Other acute postprocedural pain: Secondary | ICD-10-CM

## 2015-06-24 MED ORDER — OXYCODONE-ACETAMINOPHEN 5-325 MG PO TABS: 1.0000 | ORAL_TABLET | ORAL | Status: DC | PRN

## 2015-06-24 NOTE — Telephone Encounter (Signed)
Wife requested refill on pain medication.  Stated he is taking the Oxycodone 1 tab q 5-6 hrs, and sometimes stretches-out even longer.  Stated the incision is intact; reported a small, somewhat firm knot on the right upper incision of right leg; incision intact.  Reported minimal swelling of lower extremity, mostly surrounding the incisional area.  Denied redness or drainage.  Denied fever / chills.  Reported pt. has been walking, and tolerating activity fairly well.  Discussed with Dr. Donnetta Hutching.  Rec'd verbal order to refill Oxycodone 5/325 mg, # 30, no refills.  Advised to call office if any signs/ symptoms of infection.  Wife verb. Understanding.

## 2015-06-24 NOTE — Discharge Summary (Signed)
[facility] Discharge Summary   Patient ID:  Jacob Romero MRN: [id number] DOB/Romero: 1950-07-25 65 y.o.  Admit date: 06/17/2015 Discharge date: 06/19/2015 Date of Surgery: 06/17/2015 Surgeon: Surgeon(s): Angelia Mould, MD  Admission Diagnosis: Peripheral vascular disease with right lower extremity claudication I70.211  Discharge Diagnoses:  Peripheral vascular disease with right lower extremity claudication I70.211  Secondary Diagnoses: Past Medical History  Diagnosis Date  . Hyperlipidemia   . Hypertension   . Depression   . GERD (gastroesophageal reflux disease)   . Hiatal hernia   . CAD (coronary artery disease) 2011    RCA HSRA, known occlued PDA  . Sleep apnea     on C-pap  . PVD (peripheral vascular disease) ([place])     Rt FPBPG  . Dyspnea on exertion   . Bilateral lower extremity edema   . Wenckebach second degree AV block   . Stroke [facility])     2011  ??  VESSEL BURST IN BRAIN  DUE TO BLOOD THINNERS  . Asthma     CHRONIC BRONCHITIS  . Headache     HX  MIGRAINES   . Arthritis   . Fatty liver     Procedure(s): REDO BYPASS GRAFT RIGHT FEMORAL TO ABOVE KNEE POPLITEAL ARTERY USING LEFT NON-REVERSED GREATER SAPHENOUS VEIN VEIN HARVEST LEFT GREATER SAPHENOUS VEIN ENDARTERECTOMY RIGHT COMMON FEMORAL ARTERY  Discharged Condition: good  HPI: Jacob Romero is a 65 y.o. male, who underwent a right above-knee to below knee popliteal artery bypass with a vein graft by Dr. [name] in 2008. He was being followed by [name] with right lower extremity claudication. He experiences paresthesias in his right foot after ambulate for 15-20 minutes. He has right calf claudication which is brought on by ambulation and relieved with rest. He denies any history of rest pain. He denies any history of symptoms in the left leg.  His risk factors for peripheral vascular disease include type 2 diabetes, hypertension, hyperlipidemia, and a remote history  of tobacco use.  He underwent an arteriogram on 04/21/2015 which showed a bulky calcific plaque in his right common femoral artery with an occluded superficial femoral artery. His bypass graft was being maintained through collaterals into the distal superficial femoral artery. It's really quite remarkable at the bypass remained patent despite the superficial femoral artery occlusion.    Hospital Course:  Jacob Romero is a 65 y.o. male is S/P  Procedure(s): REDO BYPASS GRAFT RIGHT FEMORAL TO ABOVE KNEE POPLITEAL ARTERY USING LEFT NON-REVERSED GREATER SAPHENOUS VEIN VEIN HARVEST LEFT GREATER SAPHENOUS VEIN ENDARTERECTOMY RIGHT COMMON FEMORAL ARTERY  Incisions clean and dry, groins soft Doppler signals bilaterally PT/DP/Peroneal Lungs non labored breathing Heart RRR  Assessment/Planning: POD # 1 Redo right femoropopliteal bypass graft with left great saphenous vein  He is doing well over all, pain with ambulation controlled with medication PT/OT eval and treat ordered Will transfer to 2W Disposition stable  POD#2  cardiology consulted for questionable afib ASSESSMENT AND PLAN:   1. 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 Consulted for afib. NO afib seen ON Review of EKG & telemetery looks like AV Wenkebach. BP has been OK Pt asymptomatic QRS narrow Pt not on any AV blocking agents Would avoid . Will review with EP   2. CAD - status post RCA and PLA intervention.in 2011 ( high-speed rotational atherectomy, PCI and stenting of his mid RCA; unable to recanalize his PLA branch).  - Last cath because  of decline in his EF and an abnormal Myoview 11/21/12 revealing a patent RCA stent with an occluded posterior lateral branch which was chronic - Ef of 45-50% on last echo 10/2012.  Pt currently without angina  Do not think current rhythm due   POD#3 Cardiology 1 Rhythm  Review of tele pt has had several episodes of atrial fib Transient HR abruptly increases  to about 100 The drops back down to 60s (and Wenkebach) Pt asymptomatic He has not had before   CHADS VASC score is   I do not think afib driven by recent surgery Pt feels great. Will check TSH to be complete.   Review of history pt has had a history of SAH in 2011 in setting of ASA/PLAVIx/Integrelin/heparin. I have reviewed with Adora Fridge (primary cardiologist WOuld like to have neuro input on use on anticoagulant in this pt AGain, episode in Feb 2011 Notes in [facility]  Neurology input Dr. Leonie Man: I have been asked to comment on safety of long term anticoagulation in this patient for AFIB with high CHAD2VASc score by Dr Harrington Challenger and Gwenlyn Found.He has history of Left perisylvian SAH in 2011 in setting of ASA/PLAVIx/Integrelin/heparin therapy likely related to medication effect and likely a one time deal. I have reviewed patient`s chart but not seen him.Recommend eliquis over warfarin given lower risk of intracranial bleeding.Avoid using multiple agents if possible. Call for questions. Antony Contras, MD  Patient is being discharged today on Eliquis 5mg  po BID and Asprin 81 mg. We are stopping the Plavix per Dr. Leonie Man.   Disposition stable  Consults:  Treatment Team:  Rounding Lbcardiology, MD  Significant Diagnostic Studies: CBC Lab Results  Component Value Date   WBC 8.1 06/18/2015   HGB 13.8 06/18/2015   HCT 44.1 06/18/2015   MCV 84.2 06/18/2015   PLT 170 06/18/2015    BMET    Component Value Date/Time   NA 136 06/18/2015 0250   K 3.8 06/18/2015 0250   CL 101 06/18/2015 0250   CO2 30 06/18/2015 0250   GLUCOSE 126* 06/18/2015 0250   BUN 8 06/18/2015 0250   CREATININE 1.02 06/18/2015 0250   CREATININE 0.82 04/15/2015 1145   CALCIUM 8.2* 06/18/2015 0250   GFRNONAA >60 06/18/2015 0250   GFRAA >60 06/18/2015 0250   COAG Lab Results  Component Value Date   INR 1.12 06/11/2015   INR 1.10 04/15/2015   INR 1.09 11/13/2012     Disposition:  Discharge to :Home Discharge  Instructions    Call MD for:  redness, tenderness, or signs of infection (pain, swelling, bleeding, redness, odor or green/yellow discharge around incision site)    Complete by:  As directed      Call MD for:  severe or increased pain, loss or decreased feeling  in affected limb(s)    Complete by:  As directed      Call MD for:  temperature >100.5    Complete by:  As directed      Discharge instructions    Complete by:  As directed   You  May shower this evening     Discharge patient    Complete by:  As directed   Discharge pt to home     Driving Restrictions    Complete by:  As directed   No driving for 2 weeks     Lifting restrictions    Complete by:  As directed   No heavy lifting for 6 weeks     Resume previous diet  Complete by:  As directed             Medication List    STOP taking these medications        clopidogrel 75 MG tablet  Commonly known as:  PLAVIX     loratadine 10 MG tablet  Commonly known as:  CLARITIN      TAKE these medications        acetaminophen 650 MG CR tablet  Commonly known as:  TYLENOL  Take 1,000 mg by mouth 2 (two) times daily.     ADVAIR DISKUS 250-50 MCG/DOSE Aepb  Generic drug:  Fluticasone-Salmeterol  Inhale 1 puff into the lungs daily.     albuterol 108 (90 Base) MCG/ACT inhaler  Commonly known as:  PROVENTIL HFA;VENTOLIN HFA  Inhale 2 puffs into the lungs every 6 (six) hours as needed for wheezing or shortness of breath.     amLODipine 5 MG tablet  Commonly known as:  NORVASC  Take 5 mg by mouth daily.     apixaban 5 MG Tabs tablet  Commonly known as:  ELIQUIS  Take 1 tablet (5 mg total) by mouth 2 (two) times daily.     aspirin EC 81 MG tablet  Take 81 mg by mouth daily.     cetirizine 10 MG tablet  Commonly known as:  ZYRTEC  Take 10 mg by mouth daily as needed for allergies.     DULoxetine 30 MG capsule  Commonly known as:  CYMBALTA  Take 90 mg by mouth at bedtime. Take 3 tablets daily.     DYMISTA 137-50  MCG/ACT Susp  Generic drug:  Azelastine-Fluticasone  Place 1 spray into the nose daily as needed (seasonal allergies). Reported on 05/28/2015     Fiber Caps  Take 8 capsules by mouth daily.     fish oil-omega-3 fatty acids 1000 MG capsule  Take 3 g by mouth daily.     ketoconazole 2 % cream  Commonly known as:  NIZORAL  Apply 1 application topically daily.     LANCETS MICRO THIN 33G Misc  by Does not apply route 2 (two) times daily.     loratadine-pseudoephedrine 10-240 MG 24 hr tablet  Commonly known as:  CLARITIN-D 24-hour  Take 1 tablet by mouth daily.     losartan-hydrochlorothiazide 100-12.5 MG tablet  Commonly known as:  HYZAAR  Take 1 tablet by mouth daily.     metFORMIN 500 MG (MOD) 24 hr tablet  Commonly known as:  GLUMETZA  Take 1,000 mg by mouth daily with breakfast.     multivitamin with minerals Tabs tablet  Take 1 tablet by mouth daily.     nitroGLYCERIN 0.4 MG SL tablet  Commonly known as:  NITROSTAT  Place 0.4 mg under the tongue every 5 (five) minutes as needed for chest pain.     oxyCODONE-acetaminophen 5-325 MG tablet  Commonly known as:  PERCOCET/ROXICET  Take 1-2 tablets by mouth every 4 (four) hours as needed for moderate pain.     pantoprazole 40 MG tablet  Commonly known as:  PROTONIX  Take 1 tablet (40 mg total) by mouth daily.     rosuvastatin 5 MG tablet  Commonly known as:  CRESTOR  Take 5 mg by mouth 2 (two) times a week. 2 times weekly.     testosterone 50 MG/5GM (1%) Gel  Commonly known as:  ANDROGEL  Place 5 g onto the skin daily.     tetrahydrozoline-zinc 0.05-0.25 % ophthalmic solution  Commonly  known as:  VISINE-AC  Place 1 drop into both eyes 2 (two) times daily.     traMADol 50 MG tablet  Commonly known as:  ULTRAM  Take 50 mg by mouth every 12 (twelve) hours as needed for moderate pain.       Verbal and written Discharge instructions given to the patient. Wound care per Discharge AVS     Follow-up Information     Follow up with Deitra Mayo, MD In 2 weeks.   Specialties:  Vascular Surgery, Cardiology   Why:  Office will call you to arrange your appt (sent)   Contact information:   [place]  [phone] [phone]      Cardiology set up f/u appointment for this patient. SignedRoxy Horseman 06/24/2015, 11:44 AM  - For VQI Registry use --- Instructions: Press F2 to tab through selections.  Delete question if not applicable.   Post-op:  Wound infection: No  Graft infection: No  Transfusion: No  If yes,  units given New Arrhythmia: Yes Ipsilateral amputation: [x ] no, [ ]  Minor, [ ]  BKA, [ ]  AKA Discharge patency: [ ]  Primary, [x ] Primary assisted, [x ] Secondary, [ ]  Occluded Patency judged by: [x ] Dopper only, [ ]  Palpable graft pulse, [ ]  Palpable distal pulse, [ ]  ABI inc. > 0.15, [ ]  Duplex Discharge ABI: not performed in hospital this stay Discharge TBI D/C Ambulatory Status: Ambulatory  Complications: MI: [x ] No, [ ]  Troponin only, [ ]  EKG or Clinical CHF: No Resp failure: [ x] none, [ ]  Pneumonia, [ ]  Ventilator Chg in renal function: [x ] none, [ ]  Inc. Cr > 0.5, [ ]  Temp. Dialysis, [ ]  Permanent dialysis Stroke: [x ] None, [ ]  Minor, [ ]  Major Return to OR: No  Reason for return to OR: [ ]  Bleeding, [ ]  Infection, [ ]  Thrombosis, [ ]  Revision  Discharge medications: Statin use:  Yes ASA use:  Yes Plavix use:  Yes Beta blocker use: No  for medical reason   Coumadin use: No  for medical reason

## 2015-06-27 ENCOUNTER — Telehealth: Payer: Self-pay

## 2015-06-27 NOTE — Telephone Encounter (Signed)
Wife called to report the proximal end of the right leg incision is "tight", and the surrounding tissue pink.  Stated there is an orange-like drainage coming from this area.  Reported the incision is intact.  Stated the pt. Is walking at intervals during day.  Reported he lays in the recliner with legs elevated.  Advised to have pt. Lay back in recliner with legs/ hips higher than the heart, about 4 x/ day at minimum of 30 min. Intervals. Wife questioned about applying an antibacterial oint. To incision.  Advised to only clean incisions daily with Dial soap and rinse well.  Advised to continue to monitor the incisions for increased redness, warmth, separation of the incision, or cloudy, thickened drainage, fever/ chills, and report to office.  Verb. Understanding.  Has 1st post op appt. 07/02/15.

## 2015-07-02 ENCOUNTER — Ambulatory Visit (INDEPENDENT_AMBULATORY_CARE_PROVIDER_SITE_OTHER): Payer: Medicare Other | Admitting: Vascular Surgery

## 2015-07-02 ENCOUNTER — Encounter: Payer: Self-pay | Admitting: Vascular Surgery

## 2015-07-02 VITALS — BP 130/91 | HR 65 | Temp 97.3°F | Ht 68.0 in | Wt 258.9 lb

## 2015-07-02 DIAGNOSIS — Z48812 Encounter for surgical aftercare following surgery on the circulatory system: Secondary | ICD-10-CM

## 2015-07-02 NOTE — Progress Notes (Signed)
Patient name: Jacob Romero MRN: [id number] DOB: Jun 30, 1950 Sex: male  REASON FOR VISIT: Follow up after redo right femoropopliteal bypass with left great saphenous vein.  HPI: Jacob Romero is a 65 y.o. male who underwent a previous right above-knee to below-knee popliteal artery bypass by Dr. [name]. He underwent an arteriogram by [name] which showed that his superficial femoral artery above the graft was occluded. Amazingly, the graft below this had remained patent via collaterals. Therefore, to preserve his graft to vein from the left leg and bypassed from his femoral artery to the old bypass graft area this was on 06/17/2015. He comes in for a 2 week follow up visit.  The patient has done well and has no specific complaints. His ablating without difficulty. He denies fever or chills. He is not a smoker.  Current Outpatient Prescriptions  Medication Sig Dispense Refill  . acetaminophen (TYLENOL) 650 MG CR tablet Take 1,000 mg by mouth 2 (two) times daily.     [name] albuterol (PROVENTIL HFA;VENTOLIN HFA) 108 (90 BASE) MCG/ACT inhaler Inhale 2 puffs into the lungs every 6 (six) hours as needed for wheezing or shortness of breath.     [name] amLODipine (NORVASC) 5 MG tablet Take 5 mg by mouth daily.     [name] apixaban (ELIQUIS) 5 MG TABS tablet Take 1 tablet (5 mg total) by mouth 2 (two) times daily. 60 tablet 1  . aspirin EC 81 MG tablet Take 81 mg by mouth daily.      . Azelastine-Fluticasone (DYMISTA) 137-50 MCG/ACT SUSP Place 1 spray into the nose daily as needed (seasonal allergies). Reported on 05/28/2015    . budesonide-formoterol (SYMBICORT) 80-4.5 MCG/ACT inhaler Inhale 2 puffs into the lungs 2 (two) times daily.    . Chlorpheniramine-DM (CORICIDIN HBP COUGH/COLD PO) Take by mouth.    Mariane Baumgarten Sodium (COLACE PO) Take by mouth.    . DULoxetine (CYMBALTA) 30 MG capsule Take 90 mg by mouth at bedtime. Take 3 tablets daily.    . Fiber CAPS Take 8 capsules by mouth daily.     .  fish oil-omega-3 fatty acids 1000 MG capsule Take 3 g by mouth daily.     [name] ketoconazole (NIZORAL) 2 % cream Apply 1 application topically daily.    [name] LANCETS MICRO THIN 33G MISC by Does not apply route 2 (two) times daily.    [name] loratadine (CLARITIN) 10 MG tablet Take 10 mg by mouth daily.    [name] losartan-hydrochlorothiazide (HYZAAR) 100-12.5 MG tablet Take 1 tablet by mouth daily.    . metFORMIN (GLUMETZA) 500 MG (MOD) 24 hr tablet Take 1,000 mg by mouth daily with breakfast.    . Multiple Vitamin (MULTIVITAMIN WITH MINERALS) TABS tablet Take 1 tablet by mouth daily.    . nitroGLYCERIN (NITROSTAT) 0.4 MG SL tablet Place 0.4 mg under the tongue every 5 (five) minutes as needed for chest pain.    [name] oxyCODONE-acetaminophen (PERCOCET/ROXICET) 5-325 MG tablet Take 1-2 tablets by mouth every 4 (four) hours as needed for moderate pain. 30 tablet 0  . pantoprazole (PROTONIX) 40 MG tablet Take 1 tablet (40 mg total) by mouth daily. 30 tablet 11  . rosuvastatin (CRESTOR) 5 MG tablet Take 5 mg by mouth 2 (two) times a week. 2 times weekly.    [name] testosterone (ANDROGEL) 50 MG/5GM (1%) GEL Place 5 g onto the skin daily.    [name] tetrahydrozoline-zinc (VISINE-AC) 0.05-0.25 % ophthalmic solution Place 1 drop into both eyes 2 (  two) times daily.    . traMADol (ULTRAM) 50 MG tablet Take 50 mg by mouth every 12 (twelve) hours as needed for moderate pain.    [name] ADVAIR DISKUS 250-50 MCG/DOSE AEPB Inhale 1 puff into the lungs daily. Reported on 07/02/2015     No current facility-administered medications for this visit.    REVIEW OF SYSTEMS:  [X]  denotes positive finding, [ ]  denotes negative finding Cardiac  Comments:  Chest pain or chest pressure:    Shortness of breath upon exertion:    Short of breath when lying flat:    Irregular heart rhythm:    Constitutional    Fever or chills:      PHYSICAL EXAM: Filed Vitals:   07/02/15 0932  Pulse: 71  Temp: 97.3 F (36.3 C)  TempSrc: Oral  Height: 5\' 8"  (1.727 m)    Weight: 258 lb 14.4 oz (117.436 kg)  SpO2: 96%    GENERAL: The patient is a well-nourished male, in no acute distress. The vital signs are documented above. CARDIOVASCULAR: There is a regular rate and rhythm. PULMONARY: There is good air exchange bilaterally without wheezing or rales. His incisions are healing nicely. VASCULAR: He has a palpable right popliteal and posterior tibial pulse.  MEDICAL ISSUES:  STATUS POST REDO RIGHT FEMOROPOPLITEAL BYPASS:  The patient is doing well status post redo right femoropopliteal bypass graft. [name] would like to do his follow up studies in his office. This reason I'll just see him for a wound check in 2 months. He knows to call sooner if he has problems.  HYPERTENSION: The patient's initial blood pressure today was elevated. We repeated this and this was still elevated. We have encouraged the patient to follow up with their primary care physician for management of their blood pressure.   Deitra Mayo [facility] [place]: [phone]

## 2015-07-21 ENCOUNTER — Telehealth: Payer: Self-pay | Admitting: Cardiovascular Disease

## 2015-07-21 NOTE — Telephone Encounter (Signed)
New message: ° ° ° ° ° °Patient calling the office for samples of medication: ° ° °1.  What medication and dosage are you requesting samples for? eliquis 5 mg ° °2.  Are you currently out of this medication? yes ° ° °

## 2015-07-21 NOTE — Telephone Encounter (Signed)
Spoke with pt wife, aware no samples available. 

## 2015-07-25 ENCOUNTER — Telehealth: Payer: Self-pay | Admitting: Cardiovascular Disease

## 2015-07-25 NOTE — Telephone Encounter (Signed)
Closed Encounter  °

## 2015-07-28 ENCOUNTER — Encounter: Payer: Self-pay | Admitting: Cardiovascular Disease

## 2015-07-29 ENCOUNTER — Ambulatory Visit: Payer: Medicare Other | Admitting: Cardiovascular Disease

## 2015-07-30 ENCOUNTER — Other Ambulatory Visit: Payer: Self-pay | Admitting: Cardiovascular Disease

## 2015-07-30 DIAGNOSIS — I739 Peripheral vascular disease, unspecified: Secondary | ICD-10-CM

## 2015-07-30 DIAGNOSIS — I6523 Occlusion and stenosis of bilateral carotid arteries: Secondary | ICD-10-CM

## 2015-07-31 ENCOUNTER — Encounter: Payer: Self-pay | Admitting: Cardiovascular Disease

## 2015-07-31 ENCOUNTER — Ambulatory Visit (HOSPITAL_COMMUNITY)
Admission: RE | Admit: 2015-07-31 | Discharge: 2015-07-31 | Disposition: A | Discharge status: Home / Self Care | Payer: Medicare Other | Source: Ambulatory Visit | Attending: Cardiovascular Disease | Admitting: Cardiovascular Disease

## 2015-07-31 ENCOUNTER — Ambulatory Visit (INDEPENDENT_AMBULATORY_CARE_PROVIDER_SITE_OTHER): Payer: Medicare Other | Admitting: Cardiovascular Disease

## 2015-07-31 VITALS — BP 150/78 | HR 80 | Ht 68.5 in | Wt 258.5 lb

## 2015-07-31 DIAGNOSIS — I2510 Atherosclerotic heart disease of native coronary artery without angina pectoris: Secondary | ICD-10-CM | POA: Diagnosis not present

## 2015-07-31 DIAGNOSIS — I739 Peripheral vascular disease, unspecified: Secondary | ICD-10-CM | POA: Insufficient documentation

## 2015-07-31 DIAGNOSIS — E785 Hyperlipidemia, unspecified: Secondary | ICD-10-CM | POA: Insufficient documentation

## 2015-07-31 DIAGNOSIS — Z9889 Other specified postprocedural states: Secondary | ICD-10-CM | POA: Diagnosis present

## 2015-07-31 DIAGNOSIS — I10 Essential (primary) hypertension: Secondary | ICD-10-CM | POA: Insufficient documentation

## 2015-07-31 DIAGNOSIS — I7789 Other specified disorders of arteries and arterioles: Secondary | ICD-10-CM | POA: Insufficient documentation

## 2015-07-31 DIAGNOSIS — K219 Gastro-esophageal reflux disease without esophagitis: Secondary | ICD-10-CM | POA: Insufficient documentation

## 2015-07-31 DIAGNOSIS — I6523 Occlusion and stenosis of bilateral carotid arteries: Secondary | ICD-10-CM | POA: Diagnosis not present

## 2015-07-31 NOTE — Progress Notes (Signed)
07/31/2015 Jacob Romero   Jul 09, 1950  [id number]  Primary Physician Charletta Cousin, MD Primary Cardiologist: Lorretta Harp MD [name]   HPI:  The patient is a very pleasant 65 year old severely overweight married Caucasian male father of 2, grandfather of 2 grandchildren who I saw 04/15/15... He is accompanied by his wife [name] and son today.He has a history remarkable for hypertension, hyperlipidemia and known CAD. He does have sleep apnea on CPAP. He had above-to-below the knee popliteal bypass grafting by Dr. [name] after an occluded distal right SFA stent which he is now asymptomatic from and which we get annual Dopplers. I cathed him May 15, 2009 via the right radial approach revealing a high-grade mid RCA stenosis with a totally occluded PLA. There was an anterior takeoff. I attempted recanalization, however, he developed a subarachnoid hemorrhage and the procedure was aborted. Dr. Claiborne Billings brought him back and performed high-speed rotational atherectomy, PCI and stenting of his mid RCA but was unable to recanalize the PLA branch. He is completely asymptomatic with regards to this. He has got an excellent lipid profile for secondary prevention, followed closely by Dr. Micheal Likens. His total cholesterol is 140, LDL of 74 and HDL of 45.  I saw him back in the office 05/05/12 at which time he was currently stable. For the last several months he developed progressive dyspnea on exertion fatigue and lower extreme edema. The dyspnea was similar to his symptoms prior to intervention in the past.I obtained a 2-D echo which showed mild to moderate LV dysfunction with an EF in the 45% range and inferior hypokinesia. A Myoview stress test likewise showed LV dysfunction. Because of this I performed cardiac catheterization on him 11/21/12 revealing a patent mid RCA stent with an occluded posterolateral branch which is chronic. His EF was 60%. He continues to be symptomatic. The monitor showed  sinus rhythm, sinus tach and second degree AV block. I referred him to Dr. Thompson Grayer who adjusted his medications. He discontinued his beta blocker and his Mobic. He performed a cardiopulmonary exercise testing which suggested that his symptoms were related to deconditioning and obesity. The patient was clinically improved. I suggested that he seek the potential to dietitian for weight loss.since I saw him last January 2015 he has lost 25 pounds as a result of diet and exercise. He has clinically improved. He denies chest pain or shortness of breath. He retired in September and his wife [name] retired last week. He does complain of right calf claudication which is lifestyle limiting and which began approximately 6 months ago. Lower extremity arterial Doppler studies performed in our office suggested a patent above to below-the-knee popliteal bypass graft with high grade proximal disease in his common femoral and SFA. He underwent peripheral angiogram by myself 04/21/15 revealing high-grade calcified distal right common femoral artery stenosis in proximal right SFA stenosis with an occluded mid right SFA and a patent distal SFA to below the knee tibial peroneal trunk bypass graft 2 vessel runoff. The anterior tibial was occluded. Dr. Scot Dock performed right femoropopliteal bypass grafting using left greater saphenous vein on 06/17/15. He has done well since. His claudication has resolved. His ABIs are normal today although he appears to possibly have a pseudoaneurysm at the distal anastomosis. There was a question of A. Fib noted on telemetry during his hospital days and however this was reviewed by Dr. Harrington Challenger and found to be waking up. He was discharged him on Eliquis  oral regulation which  I do not think he needs and I'm going to discontinue.  Current Outpatient Prescriptions  Medication Sig Dispense Refill  . acetaminophen (TYLENOL) 650 MG CR tablet Take 1,000 mg by mouth 2 (two) times daily.     [name] albuterol  (PROVENTIL HFA;VENTOLIN HFA) 108 (90 BASE) MCG/ACT inhaler Inhale 2 puffs into the lungs every 6 (six) hours as needed for wheezing or shortness of breath.     [name] amLODipine (NORVASC) 5 MG tablet Take 5 mg by mouth daily.     [name] aspirin EC 81 MG tablet Take 81 mg by mouth daily.      . budesonide-formoterol (SYMBICORT) 80-4.5 MCG/ACT inhaler Inhale 2 puffs into the lungs 2 (two) times daily.    . Chlorpheniramine-DM (CORICIDIN HBP COUGH/COLD PO) Take by mouth.    Mariane Baumgarten Sodium (COLACE PO) Take by mouth.    . DULoxetine (CYMBALTA) 30 MG capsule Take 90 mg by mouth at bedtime. Take 3 tablets daily.    . Fiber CAPS Take 8 capsules by mouth daily.     . fish oil-omega-3 fatty acids 1000 MG capsule Take 3 g by mouth daily.     . furosemide (LASIX) 40 MG tablet Take 40 mg by mouth daily as needed.    [name] glucose blood test strip 1 each by Other route 2 (two) times daily. Use as instructed    . ketoconazole (NIZORAL) 2 % cream Apply 1 application topically daily.    [name] LANCETS MICRO THIN 33G MISC by Does not apply route 2 (two) times daily.    [name] loratadine (CLARITIN) 10 MG tablet Take 10 mg by mouth daily.    [name] losartan-hydrochlorothiazide (HYZAAR) 100-12.5 MG tablet Take 1 tablet by mouth daily.    . metFORMIN (GLUMETZA) 500 MG (MOD) 24 hr tablet Take 1,000 mg by mouth daily with breakfast.    . Multiple Vitamin (MULTIVITAMIN WITH MINERALS) TABS tablet Take 1 tablet by mouth daily.    . nitroGLYCERIN (NITROSTAT) 0.4 MG SL tablet Place 0.4 mg under the tongue every 5 (five) minutes as needed for chest pain.    . pantoprazole (PROTONIX) 40 MG tablet Take 1 tablet (40 mg total) by mouth daily. 30 tablet 11  . rosuvastatin (CRESTOR) 5 MG tablet Take 5 mg by mouth 2 (two) times a week. 2 times weekly.    [name] testosterone (ANDROGEL) 50 MG/5GM (1%) GEL Place 5 g onto the skin daily.    [name] tetrahydrozoline-zinc (VISINE-AC) 0.05-0.25 % ophthalmic solution Place 1 drop into both eyes 2 (two) times daily.    .  traMADol (ULTRAM) 50 MG tablet Take 50 mg by mouth every 12 (twelve) hours as needed for moderate pain.     No current facility-administered medications for this visit.    Allergies  Allergen Reactions  . Bee Venom Swelling  . Statins Other (See Comments)    Unable to tolerate high dose statins    Social History   Social History  . Marital Status: Married    Spouse Name: N/A  . Number of Children: N/A  . Years of Education: N/A   Occupational History  . Not on file.   Social History Main Topics  . Smoking status: Former Smoker    Types: Cigarettes    Quit date: 06/01/1991  . Smokeless tobacco: Never Used  . Alcohol Use: 0.0 oz/week    0 Standard drinks or equivalent per week     Comment: RARE  . Drug Use: No  . Sexual Activity: Not  on file   Other Topics Concern  . 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  Cardiovascular: negative for chest pain, dyspnea on exertion, edema, orthopnea, palpitations, paroxysmal nocturnal dyspnea or shortness of breath Dermatological: negative for rash Respiratory: negative for cough or wheezing Urologic: negative for hematuria Abdominal: negative for nausea, vomiting, diarrhea, bright red blood per rectum, melena, or hematemesis Neurologic: negative for visual changes, syncope, or dizziness All other systems reviewed and are otherwise negative except as noted above.    Blood pressure 150/78, pulse 80, height 5' 8.5" (1.74 m), weight 258 lb 8 oz (117.255 kg).  General appearance: alert and no distress Neck: no adenopathy, no carotid bruit, no JVD, supple, symmetrical, trachea midline and thyroid not enlarged, symmetric, no tenderness/mass/nodules Lungs: clear to auscultation bilaterally Heart: regular rate and rhythm, S1, S2 normal, no murmur, click, rub or gallop Extremities: extremities normal,  atraumatic, no cyanosis or edema  EKG not performed today  ASSESSMENT AND PLAN:   PAD (peripheral artery disease) ([place]) Mr. Beatris Ship underwent peripheral angiography by myself 04/21/15 demonstrating a high grade calcified subtotally occluded distal right common femoral artery and SFA with patent above the knee to below the knee popliteal artery bypass 3 vessel runoff. He underwent redo right femoropopliteal bypass grafting using left greater saphenous vein by Dr. Scot Dock 06/17/15. He's been well since. Dopplers performed today in our office revealed normal ABIs with her appears to be a potential pseudoaneurysm at the distal anastomosis site. His claudication has resolved. I'm going to have him see Dr. Scot Dock in the office next week for further evaluation prior to him moving to [facility] soon thereafter.      Lorretta Harp MD FACP,FACC,FAHA, [place] [facility] 07/31/2015 12:40 PM

## 2015-07-31 NOTE — Assessment & Plan Note (Signed)
Mr. Jacob Romero underwent peripheral angiography by myself 04/21/15 demonstrating a high grade calcified subtotally occluded distal right common femoral artery and SFA with patent above the knee to below the knee popliteal artery bypass 3 vessel runoff. He underwent redo right femoropopliteal bypass grafting using left greater saphenous vein by Dr. Scot Dock 06/17/15. He's been well since. Dopplers performed today in our office revealed normal ABIs with her appears to be a potential pseudoaneurysm at the distal anastomosis site. His claudication has resolved. I'm going to have him see Dr. Scot Dock in the office next week for further evaluation prior to him moving to [facility] soon thereafter.

## 2015-07-31 NOTE — Patient Instructions (Signed)
Medication Instructions:  Your physician has recommended you make the following change in your medication:  1) STOP Eliquis   Labwork: none  Testing/Procedures: none  Follow-Up: Your physician recommends that you schedule a follow-up appointment in: [place] DR. Harrell Gave DICKSON  Follow up with DR. BERRY as needed.   Any Other Special Instructions Will Be Listed Below (If Applicable).     If you need a refill on your cardiac medications before your next appointment, please call your pharmacy.

## 2015-08-01 ENCOUNTER — Encounter: Payer: Self-pay | Admitting: Vascular Surgery

## 2015-08-04 ENCOUNTER — Encounter: Payer: Self-pay | Admitting: Vascular Surgery

## 2015-08-04 ENCOUNTER — Telehealth: Payer: Self-pay

## 2015-08-04 DIAGNOSIS — T8140XA Infection following a procedure, unspecified, initial encounter: Secondary | ICD-10-CM

## 2015-08-04 MED ORDER — CEPHALEXIN 500 MG PO CAPS
500.0000 mg | ORAL_CAPSULE | Freq: Three times a day (TID) | ORAL | Status: DC
Start: 2015-08-04 — End: 2023-11-15

## 2015-08-04 NOTE — Telephone Encounter (Signed)
Pt's. symptoms readdressed with Dr. Trula Slade.  Recommended to start Cephalexin 500 mg TID x 7 days, and to keep appt. With Dr. Scot Dock on Wednesday, 5/10.  Phone call to pt.; spoke with pt's wife, and advised of Dr. Stephens Shire recommendation to start an antibiotic.  Advised to start prescription today, and to drink a lot of water with the antibiotic.  Wife verb. Understanding.

## 2015-08-04 NOTE — Telephone Encounter (Signed)
Pt. Called to report worsening pain and swelling of the left knee; described swelling bilateral knee, and behind left knee.  Stated the swelling and discomfort has worsened over the past 3-5 days.  Stated there is approx. 1/2 golf ball-sized raised area behind left knee that throbs.  Stated there is redness at the lower end of distal left leg incision.  Stated he was chilling last evening, but had no fever @ that time.  Offered an appt. @ 9:45 AM 5/10.  Pt. questioned if he should wait until Wed. to be checked.  Reported symptoms to Dr. Trula Slade.  Advised to have pt. see Dr. Scot Dock on Wednesday.

## 2015-08-06 ENCOUNTER — Other Ambulatory Visit: Payer: Self-pay

## 2015-08-06 ENCOUNTER — Encounter: Payer: Self-pay | Admitting: Vascular Surgery

## 2015-08-06 ENCOUNTER — Ambulatory Visit (HOSPITAL_COMMUNITY)
Admission: RE | Admit: 2015-08-06 | Discharge: 2015-08-06 | Disposition: A | Discharge status: Home / Self Care | Payer: Medicare Other | Source: Ambulatory Visit | Attending: Vascular Surgery | Admitting: Vascular Surgery

## 2015-08-06 ENCOUNTER — Ambulatory Visit (INDEPENDENT_AMBULATORY_CARE_PROVIDER_SITE_OTHER): Payer: Medicare Other | Admitting: Vascular Surgery

## 2015-08-06 VITALS — Temp 97.0°F | Resp 20 | Ht 68.5 in | Wt 262.8 lb

## 2015-08-06 DIAGNOSIS — Z48812 Encounter for surgical aftercare following surgery on the circulatory system: Secondary | ICD-10-CM

## 2015-08-06 DIAGNOSIS — G4730 Sleep apnea, unspecified: Secondary | ICD-10-CM | POA: Insufficient documentation

## 2015-08-06 DIAGNOSIS — I10 Essential (primary) hypertension: Secondary | ICD-10-CM | POA: Insufficient documentation

## 2015-08-06 DIAGNOSIS — M7989 Other specified soft tissue disorders: Secondary | ICD-10-CM

## 2015-08-06 DIAGNOSIS — K219 Gastro-esophageal reflux disease without esophagitis: Secondary | ICD-10-CM | POA: Insufficient documentation

## 2015-08-06 DIAGNOSIS — M79605 Pain in left leg: Secondary | ICD-10-CM | POA: Diagnosis not present

## 2015-08-06 DIAGNOSIS — E785 Hyperlipidemia, unspecified: Secondary | ICD-10-CM | POA: Diagnosis not present

## 2015-08-06 DIAGNOSIS — I2510 Atherosclerotic heart disease of native coronary artery without angina pectoris: Secondary | ICD-10-CM | POA: Diagnosis not present

## 2015-08-06 NOTE — Progress Notes (Signed)
Patient name: Jacob Romero MRN: [id number] DOB: [birth year] Sex: male  REASON FOR VISIT: Follow up after redo right femoropopliteal bypass.  HPI: Jacob Romero is a 65 y.o. male who underwent a previous right above-knee to below knee popliteal artery bypass by Dr. [name]. He underwent an arteriogram by [name] which showed that his superficial femoral artery above the graft was occluded. The graft amazingly stayed open via collaterals. On 06/17/2015, he underwent a bypass from the right femoral artery to the old bypass graft using vein from the left leg.  He was seen by [name] on 07/31/2015. A duplex scan at that time suggested a possible pseudoaneurysm of the distal anastomosis which would be the old bypass graft below the knee. He had no recent surgery on this area. His arteriogram back in January showed no problems here and I suspect that this is simply that the vein graft is slightly dilated with some laminated thrombus. However, I am unable to review these images.  His main complaint today has been pain and swelling in the left leg especially around the knee. He has been on Eliquis in the past for A. Fib but is no longer on this.   Current Outpatient Prescriptions  Medication Sig Dispense Refill  . acetaminophen (TYLENOL) 650 MG CR tablet Take 1,000 mg by mouth 2 (two) times daily.     [name] albuterol (PROVENTIL HFA;VENTOLIN HFA) 108 (90 BASE) MCG/ACT inhaler Inhale 2 puffs into the lungs every 6 (six) hours as needed for wheezing or shortness of breath.     [name] amLODipine (NORVASC) 5 MG tablet Take 5 mg by mouth daily.     [name] aspirin EC 81 MG tablet Take 81 mg by mouth daily.      . budesonide-formoterol (SYMBICORT) 80-4.5 MCG/ACT inhaler Inhale 2 puffs into the lungs 2 (two) times daily.    . cephALEXin (KEFLEX) 500 MG capsule Take 1 capsule (500 mg total) by mouth 3 (three) times daily. 21 capsule 0  . Chlorpheniramine-DM (CORICIDIN HBP COUGH/COLD PO) Take by  mouth.    Mariane Baumgarten Sodium (COLACE PO) Take by mouth.    . DULoxetine (CYMBALTA) 30 MG capsule Take 90 mg by mouth at bedtime. Take 3 tablets daily.    . Fiber CAPS Take 8 capsules by mouth daily.     . fish oil-omega-3 fatty acids 1000 MG capsule Take 3 g by mouth daily.     . furosemide (LASIX) 40 MG tablet Take 40 mg by mouth daily as needed.    [name] glucose blood test strip 1 each by Other route 2 (two) times daily. Use as instructed    . ketoconazole (NIZORAL) 2 % cream Apply 1 application topically daily.    [name] LANCETS MICRO THIN 33G MISC by Does not apply route 2 (two) times daily.    [name] loratadine (CLARITIN) 10 MG tablet Take 10 mg by mouth daily.    [name] losartan-hydrochlorothiazide (HYZAAR) 100-12.5 MG tablet Take 1 tablet by mouth daily.    . metFORMIN (GLUMETZA) 500 MG (MOD) 24 hr tablet Take 1,000 mg by mouth daily with breakfast.    . Multiple Vitamin (MULTIVITAMIN WITH MINERALS) TABS tablet Take 1 tablet by mouth daily.    . nitroGLYCERIN (NITROSTAT) 0.4 MG SL tablet Place 0.4 mg under the tongue every 5 (five) minutes as needed for chest pain.    . pantoprazole (PROTONIX) 40 MG tablet Take 1 tablet (40 mg total) by mouth daily. 30 tablet  11  . rosuvastatin (CRESTOR) 5 MG tablet Take 5 mg by mouth 2 (two) times a week. 2 times weekly.    [name] testosterone (ANDROGEL) 50 MG/5GM (1%) GEL Place 5 g onto the skin daily.    [name] tetrahydrozoline-zinc (VISINE-AC) 0.05-0.25 % ophthalmic solution Place 1 drop into both eyes 2 (two) times daily.    . traMADol (ULTRAM) 50 MG tablet Take 50 mg by mouth every 12 (twelve) hours as needed for moderate pain.     No current facility-administered medications for this visit.    REVIEW OF SYSTEMS:  [X]  denotes positive finding, [ ]  denotes negative finding Cardiac  Comments:  Chest pain or chest pressure:    Shortness of breath upon exertion:    Short of breath when lying flat:    Irregular heart rhythm:    Constitutional    Fever or chills:       PHYSICAL EXAM: Filed Vitals:   08/06/15 0931  Temp: 97 F (36.1 C)  TempSrc: Oral  Resp: 20  Height: 5' 8.5" (1.74 m)  Weight: 262 lb 12.8 oz (119.205 kg)    GENERAL: The patient is a well-nourished male, in no acute distress. The vital signs are documented above. CARDIOVASCULAR: There is a regular rate and rhythm. PULMONARY: There is good air exchange bilaterally without wheezing or rales. He does have moderate swelling around the left knee. His vein harvest incisions look fine and I do not see how the swelling around the knee could be related to his vein harvest. He has a palpable femoral, popliteal, and posterior tibial pulse on the right. His incisions on the right leg are healing nicely also.  MEDICAL ISSUES:  LEFT KNEE PAIN AND SWELLING: Given the swelling in the left leg I have recommended that he have a venous duplex can to rule out a DVT. If there is note DVT that he may need orthopedic evaluation of his knee given that the pain and swelling appears to be localized around the knee. I do not see how this could be related to his vein harvest which was very superficial.  ADDENDUM: I have independently interpreted his venous duplex of the left lower extremity which shows no evidence of DVT on the left. He may have a ruptured Baker cyst behind his knee. I have discussed the option of getting an orthopedic evaluation.  STATUS POST REDO RIGHT FEMORAL TO POPLITEAL ARTERY BYPASS: his recent surgery involved bypass from the femoral artery to the vein graft above the knee. Therefore the finding on duplex suggesting a possible pseudoaneurysm of the distal anastomosis does not appear to be related to the recent surgery. He may have some dilation of the vein distally with some mild laminated thrombus. Therefore not overly concerned about this however I have not been able to review the images which we are trying to obtain. We are trying to obtain the images on a CD. We did do a limited study  in our office and did not see any obvious evidence of pseudoaneurysm.  Deitra Mayo [facility] [place]: [phone]

## 2015-08-08 ENCOUNTER — Ambulatory Visit: Payer: Medicare Other | Admitting: Vascular Surgery

## 2015-08-27 ENCOUNTER — Other Ambulatory Visit: Payer: Self-pay | Admitting: Cardiovascular Disease

## 2015-08-27 NOTE — Telephone Encounter (Signed)
Rx(s) sent to pharmacy electronically.  

## 2015-09-04 ENCOUNTER — Encounter: Payer: Self-pay | Admitting: Vascular Surgery

## 2015-09-10 ENCOUNTER — Ambulatory Visit: Payer: Medicare Other | Admitting: Vascular Surgery

## 2016-03-04 ENCOUNTER — Other Ambulatory Visit: Payer: Self-pay | Admitting: Cardiovascular Disease

## 2016-03-04 DIAGNOSIS — I739 Peripheral vascular disease, unspecified: Secondary | ICD-10-CM

## 2016-03-09 ENCOUNTER — Ambulatory Visit (HOSPITAL_COMMUNITY)
Admission: RE | Admit: 2016-03-09 | Discharge: 2016-03-09 | Disposition: A | Discharge status: Home / Self Care | Payer: Medicare Other | Source: Ambulatory Visit | Attending: Internal Medicine | Admitting: Internal Medicine

## 2016-03-09 DIAGNOSIS — I739 Peripheral vascular disease, unspecified: Secondary | ICD-10-CM | POA: Diagnosis not present

## 2016-03-10 ENCOUNTER — Other Ambulatory Visit: Payer: Self-pay | Admitting: Cardiovascular Disease

## 2016-03-10 DIAGNOSIS — I739 Peripheral vascular disease, unspecified: Secondary | ICD-10-CM

## 2023-11-28 DEATH — deceased
